# Patient Record
Sex: Female | Born: 1945 | Race: White | Hispanic: No | Marital: Married | Smoking: Never smoker
Health system: Southern US, Community
[De-identification: ages and names within clinical notes are randomized; demographics above are authoritative.]

## PROBLEM LIST (undated history)

## (undated) DIAGNOSIS — B029 Zoster without complications: Secondary | ICD-10-CM

## (undated) DIAGNOSIS — I10 Essential (primary) hypertension: Secondary | ICD-10-CM

## (undated) DIAGNOSIS — R0600 Dyspnea, unspecified: Secondary | ICD-10-CM

## (undated) DIAGNOSIS — R7301 Impaired fasting glucose: Secondary | ICD-10-CM

## (undated) DIAGNOSIS — R079 Chest pain, unspecified: Secondary | ICD-10-CM

## (undated) DIAGNOSIS — E039 Hypothyroidism, unspecified: Secondary | ICD-10-CM

## (undated) DIAGNOSIS — E079 Disorder of thyroid, unspecified: Secondary | ICD-10-CM

## (undated) DIAGNOSIS — E038 Other specified hypothyroidism: Secondary | ICD-10-CM

## (undated) HISTORY — DX: Chest pain, unspecified: R07.9

## (undated) HISTORY — DX: Disorder of thyroid, unspecified: E07.9

## (undated) HISTORY — PX: WRIST SURGERY: SHX841

## (undated) HISTORY — DX: Zoster without complications: B02.9

## (undated) HISTORY — DX: Dyspnea, unspecified: R06.00

## (undated) HISTORY — PX: KNEE ARTHROSCOPY: SUR90

## (undated) HISTORY — DX: Impaired fasting glucose: R73.01

## (undated) HISTORY — DX: Essential (primary) hypertension: I10

## (undated) HISTORY — PX: VEIN LIGATION AND STRIPPING: SHX2653

## (undated) HISTORY — DX: Hypothyroidism, unspecified: E03.9

## (undated) HISTORY — PX: LUNG REMOVAL, PARTIAL: SHX233

## (undated) HISTORY — DX: Other specified hypothyroidism: E03.8

---

## 1998-01-27 ENCOUNTER — Ambulatory Visit (HOSPITAL_COMMUNITY): Admission: RE | Admit: 1998-01-27 | Discharge: 1998-01-27 | Payer: Self-pay | Admitting: Obstetrics and Gynecology

## 1998-04-07 ENCOUNTER — Ambulatory Visit: Admission: RE | Admit: 1998-04-07 | Discharge: 1998-04-07 | Payer: Self-pay | Admitting: Obstetrics and Gynecology

## 1998-09-01 ENCOUNTER — Ambulatory Visit (HOSPITAL_COMMUNITY): Admission: RE | Admit: 1998-09-01 | Discharge: 1998-09-01 | Payer: Self-pay | Admitting: *Deleted

## 1998-09-01 ENCOUNTER — Encounter: Payer: Self-pay | Admitting: *Deleted

## 1999-04-06 ENCOUNTER — Ambulatory Visit (HOSPITAL_COMMUNITY): Admission: RE | Admit: 1999-04-06 | Discharge: 1999-04-06 | Payer: Self-pay | Admitting: Obstetrics and Gynecology

## 1999-04-06 ENCOUNTER — Encounter: Payer: Self-pay | Admitting: Obstetrics and Gynecology

## 1999-11-22 ENCOUNTER — Encounter: Admission: RE | Admit: 1999-11-22 | Discharge: 1999-11-22 | Payer: Self-pay | Admitting: Internal Medicine

## 1999-11-22 ENCOUNTER — Encounter: Payer: Self-pay | Admitting: Internal Medicine

## 1999-12-31 ENCOUNTER — Ambulatory Visit (HOSPITAL_COMMUNITY): Admission: RE | Admit: 1999-12-31 | Discharge: 1999-12-31 | Payer: Self-pay | Admitting: Gastroenterology

## 2000-06-09 ENCOUNTER — Other Ambulatory Visit: Admission: RE | Admit: 2000-06-09 | Discharge: 2000-06-09 | Payer: Self-pay | Admitting: Internal Medicine

## 2000-11-24 ENCOUNTER — Encounter: Admission: RE | Admit: 2000-11-24 | Discharge: 2000-11-24 | Payer: Self-pay | Admitting: Internal Medicine

## 2000-11-24 ENCOUNTER — Encounter: Payer: Self-pay | Admitting: Internal Medicine

## 2001-07-13 ENCOUNTER — Other Ambulatory Visit: Admission: RE | Admit: 2001-07-13 | Discharge: 2001-07-13 | Payer: Self-pay | Admitting: Internal Medicine

## 2001-08-24 ENCOUNTER — Encounter: Payer: Self-pay | Admitting: Internal Medicine

## 2001-08-24 ENCOUNTER — Ambulatory Visit (HOSPITAL_COMMUNITY): Admission: RE | Admit: 2001-08-24 | Discharge: 2001-08-24 | Payer: Self-pay | Admitting: Internal Medicine

## 2001-11-27 ENCOUNTER — Encounter: Admission: RE | Admit: 2001-11-27 | Discharge: 2001-11-27 | Payer: Self-pay | Admitting: Internal Medicine

## 2001-11-27 ENCOUNTER — Encounter: Payer: Self-pay | Admitting: Internal Medicine

## 2002-10-11 ENCOUNTER — Encounter: Payer: Self-pay | Admitting: Internal Medicine

## 2002-10-11 ENCOUNTER — Ambulatory Visit (HOSPITAL_COMMUNITY): Admission: RE | Admit: 2002-10-11 | Discharge: 2002-10-11 | Payer: Self-pay | Admitting: Internal Medicine

## 2002-10-11 ENCOUNTER — Other Ambulatory Visit: Admission: RE | Admit: 2002-10-11 | Discharge: 2002-10-11 | Payer: Self-pay | Admitting: Internal Medicine

## 2003-11-13 ENCOUNTER — Ambulatory Visit (HOSPITAL_COMMUNITY): Admission: RE | Admit: 2003-11-13 | Discharge: 2003-11-13 | Payer: Self-pay | Admitting: Internal Medicine

## 2003-12-23 ENCOUNTER — Other Ambulatory Visit: Admission: RE | Admit: 2003-12-23 | Discharge: 2003-12-23 | Payer: Self-pay | Admitting: Internal Medicine

## 2004-01-01 ENCOUNTER — Ambulatory Visit (HOSPITAL_COMMUNITY): Admission: RE | Admit: 2004-01-01 | Discharge: 2004-01-01 | Payer: Self-pay | Admitting: Orthopedic Surgery

## 2004-04-21 ENCOUNTER — Encounter: Admission: RE | Admit: 2004-04-21 | Discharge: 2004-04-21 | Payer: Self-pay | Admitting: Orthopedic Surgery

## 2005-01-14 ENCOUNTER — Encounter: Admission: RE | Admit: 2005-01-14 | Discharge: 2005-01-14 | Payer: Self-pay | Admitting: Internal Medicine

## 2005-03-25 ENCOUNTER — Other Ambulatory Visit: Admission: RE | Admit: 2005-03-25 | Discharge: 2005-03-25 | Payer: Self-pay | Admitting: Internal Medicine

## 2005-03-31 ENCOUNTER — Encounter: Admission: RE | Admit: 2005-03-31 | Discharge: 2005-03-31 | Payer: Self-pay | Admitting: Internal Medicine

## 2005-05-02 ENCOUNTER — Ambulatory Visit: Payer: Self-pay | Admitting: Cardiovascular Disease

## 2005-05-26 ENCOUNTER — Ambulatory Visit (HOSPITAL_COMMUNITY): Admission: RE | Admit: 2005-05-26 | Discharge: 2005-05-26 | Payer: Self-pay | Admitting: Cardiovascular Disease

## 2005-05-26 ENCOUNTER — Ambulatory Visit: Payer: Self-pay | Admitting: Cardiovascular Disease

## 2005-06-21 ENCOUNTER — Encounter: Admission: RE | Admit: 2005-06-21 | Discharge: 2005-06-21 | Payer: Self-pay | Admitting: Internal Medicine

## 2005-06-23 ENCOUNTER — Ambulatory Visit: Payer: Self-pay | Admitting: Cardiovascular Disease

## 2006-01-27 ENCOUNTER — Encounter: Admission: RE | Admit: 2006-01-27 | Discharge: 2006-01-27 | Payer: Self-pay | Admitting: Internal Medicine

## 2006-01-31 ENCOUNTER — Ambulatory Visit: Payer: Self-pay | Admitting: Oncology

## 2006-04-12 ENCOUNTER — Ambulatory Visit: Payer: Self-pay | Admitting: Oncology

## 2006-08-03 ENCOUNTER — Ambulatory Visit: Payer: Self-pay | Admitting: Cardiovascular Disease

## 2007-03-01 ENCOUNTER — Encounter: Admission: RE | Admit: 2007-03-01 | Discharge: 2007-03-01 | Payer: Self-pay | Admitting: Internal Medicine

## 2007-03-06 ENCOUNTER — Encounter: Admission: RE | Admit: 2007-03-06 | Discharge: 2007-03-06 | Payer: Self-pay | Admitting: Internal Medicine

## 2007-09-13 ENCOUNTER — Ambulatory Visit: Payer: Self-pay | Admitting: Cardiovascular Disease

## 2008-03-06 ENCOUNTER — Encounter: Admission: RE | Admit: 2008-03-06 | Discharge: 2008-03-06 | Payer: Self-pay | Admitting: Internal Medicine

## 2008-11-25 DIAGNOSIS — R079 Chest pain, unspecified: Secondary | ICD-10-CM

## 2008-11-25 DIAGNOSIS — R9431 Abnormal electrocardiogram [ECG] [EKG]: Secondary | ICD-10-CM

## 2008-11-25 DIAGNOSIS — E785 Hyperlipidemia, unspecified: Secondary | ICD-10-CM

## 2008-11-25 DIAGNOSIS — R0602 Shortness of breath: Secondary | ICD-10-CM

## 2008-11-27 ENCOUNTER — Ambulatory Visit: Payer: Self-pay | Admitting: Cardiovascular Disease

## 2009-03-13 ENCOUNTER — Encounter: Admission: RE | Admit: 2009-03-13 | Discharge: 2009-03-13 | Payer: Self-pay | Admitting: Internal Medicine

## 2009-10-08 ENCOUNTER — Encounter (INDEPENDENT_AMBULATORY_CARE_PROVIDER_SITE_OTHER): Payer: Self-pay | Admitting: *Deleted

## 2010-02-24 ENCOUNTER — Encounter: Admission: RE | Admit: 2010-02-24 | Discharge: 2010-02-24 | Payer: Self-pay | Admitting: Internal Medicine

## 2010-06-13 ENCOUNTER — Encounter: Payer: Self-pay | Admitting: Cardiovascular Disease

## 2010-06-22 NOTE — Letter (Signed)
Summary: Appointment - Reminder 2  [employer], Main Office  [address]   [place], [place] [address]   Phone: [phone]  Fax: [phone]     Oct 08, 2009 MRN: [id number]   [facility] [address] [place], [place]  [address]   Dear Ms. Garris,  Our records indicate that it is time to schedule a follow-up appointment with Dr. [name]. It is very important that we reach you to schedule this appointment. We look forward to participating in your health care needs. Please contact us at the number listed above at your earliest convenience to schedule your appointment.  If you are unable to make an appointment at this time, give [place] a call so we can update our records.     Sincerely,  [facility] Scheduling Team

## 2010-07-19 ENCOUNTER — Encounter: Payer: Self-pay | Admitting: Cardiovascular Disease

## 2010-07-19 ENCOUNTER — Ambulatory Visit (INDEPENDENT_AMBULATORY_CARE_PROVIDER_SITE_OTHER): Payer: Medicare Other | Admitting: Cardiovascular Disease

## 2010-07-19 DIAGNOSIS — E785 Hyperlipidemia, unspecified: Secondary | ICD-10-CM

## 2010-07-19 DIAGNOSIS — I4510 Unspecified right bundle-branch block: Secondary | ICD-10-CM

## 2010-07-29 NOTE — Assessment & Plan Note (Signed)
Summary: ROV GD PER PT CALL. GD/CT appt confirm=mj   History of Present Illness: HISTORY:  Bridget Hartman returns today for followup.  She has had atypical chest pain in the past.  Normal myovue in 2004  She has had a cardiac CT done in January 2007.She had a calcium score of zero.  No significant coronary artery disease.  She has hypercholesterolemia.  She has recently had this checked by Dr. Clelia Croft.  Her LFTs were normal.  She recounts that her total cholesterol was in the 200 range.  She is on 10 of Lipitor.  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  Recommended weight loss low sodium diet and lifestyle adjustments   Current Problems (verified): 1)  Electrocardiogram, Abnormal  (ICD-794.31) 2)  Shortness of Breath  (ICD-786.05) 3)  Hyperlipidemia  (ICD-272.4) 4)  Chest Pain  (ICD-786.50)  Current Medications (verified): 1)  Lipitor 10 Mg Tabs (Atorvastatin Calcium) .... Take One Tablet By Mouth Daily. 2)  Multivitamins   Tabs (Multiple Vitamin) .Marland Kitchen.. 1 Tab By Mouth Once Daily 3)  Flax   Oil (Flaxseed (Linseed)) .Marland Kitchen.. 1 Tab By Mouth Once Daily 4)  Calcium 1200 1200-1000 Mg-Unit Chew (Calcium Carbonate-Vit D-Min) .Marland Kitchen.. 1 By Mouth Daily 5)  Glucosamine 1500 Complex  Caps (Glucosamine-Chondroit-Vit C-Mn) .Marland Kitchen.. 1 By Mouth Daily 6)  B Complex-Vitamin C  Caps (B Complex-C) .Marland Kitchen.. 1 By Mouth Daily  Allergies (verified): No Known Drug Allergies  Past History:  Past Medical History: Last updated: 11/25/2008 Current Problems:  SHORTNESS OF BREATH (ICD-786.05) HYPERLIPIDEMIA (ICD-272.4) CHEST PAIN (ICD-786.50) calcium score 0 normal cardiac CT 2007 Bifasicular Block (RBBB LAD)  Past Surgical History: Last updated: 11/25/2008  Colonoscopy Cysts wrist and shoulder Lower lobe of lung removed Varicose veins of leg  Family History: Last updated: 11/25/2008 noncontributory  Social History: Last updated: 11/27/2008 Retired    non-smoker non-drinker Swims 26mile/day in pool in back yard  Review of Systems       Denies fever, malais, weight loss, blurry vision, decreased visual acuity, cough, sputum, SOB, hemoptysis, pleuritic pain, palpitaitons, heartburn, abdominal pain, melena, lower extremity edema, claudication, or rash.   Vital Signs:  Patient profile:   65 year old female Height:      66 inches Weight:      187 pounds BMI:     30.29 Pulse rate:   92 / minute Resp:     16 per minute BP sitting:   135 / 88  (right arm)  Vitals Entered By: Marrion Coy, CNA (July 19, 2010 10:27 AM)  Physical Exam  General:  Affect appropriate Healthy:  appears stated age HEENT: normal Neck supple with no adenopathy JVP normal no bruits no thyromegaly Lungs clear with no wheezing and good diaphragmatic motion Heart:  S1/S2 no murmur,rub, gallop or click PMI normal Abdomen: benighn, BS positve, no tenderness, no AAA no bruit.  No HSM or HJR Distal pulses intact with no bruits No edema Neuro non-focal Skin warm and dry    Impression & Recommendations:  Problem # 1:  ELECTROCARDIOGRAM, ABNORMAL (ICD-794.31) No change ICRBBB yearly ECG advisable  Problem # 2:  HYPERLIPIDEMIA (ICD-272.4) At goal labs with Dr Clelia Croft Her updated medication list for this problem includes:    Lipitor 10 Mg Tabs (Atorvastatin calcium) .Marland Kitchen... Take one tablet by mouth daily.  Problem # 3:  CHEST PAIN (ICD-786.50) Non recurrent.  Normal CARADIAC CT 2007   EKG Report  Procedure date:  07/19/2010  Findings:      NSR 92 ICRBBB LAF B

## 2010-10-05 NOTE — Assessment & Plan Note (Signed)
[facility]                            CARDIOLOGY OFFICE NOTE   Bridget Hartman, Bridget Hartman                       MRN:          [id number]  DATE:09/13/2007                            DOB:          Oct 27, 1945    HISTORY:  Bridget Hartman returns today for followup.  She has had atypical  chest pain in the past.  She has had a cardiac CT done in January 2007.  She had a calcium score of zero.  No significant coronary artery  disease.  She has hypercholesterolemia.  She has recently had this  checked by Bridget Hartman.  Her LFTs were normal.  She recounts that her total  cholesterol was in the 200 range.  She is on 10 of Lipitor.   REVIEW OF SYSTEMS:  Remarkable for occasional muscle aches and pains.  It does not sound like significant myalgias.   The patient is also status post previous left lower lobectomy for a  cyst.  She has not had any recurrences, but has mild chronic exertional  dyspnea.   MEDICATIONS:  1. Lipitor 10 a day.  2. Multivitamins.  3. Flaxseed oil.   PRIMARY CARE PHYSICIAN:  Now is Bridget Hartman.   PHYSICAL EXAMINATION:  GENERAL:  Remarkable for a healthy-appearing  elderly white female in no distress.  VITAL SIGNS:  Weight is 189, blood  pressure is 150/80, pulse 80 and regular, respiratory rate 14, afebrile.  HEENT:  Unremarkable.  NECK:  Carotids are normal without bruit, no lymphadenopathy,  thyromegaly or JVP elevation.  LUNGS:  Clear with good diaphragmatic motion.  There is no wheezing.  There is slight volume loss in the left lower lobe.  HEART:  There is an S1-S2 with normal heart sounds.  PMI normal.  ABDOMEN:  Benign.  Bowel sounds are positive.  No AAA, no tenderness, no  hepatosplenomegaly or hepatojugular reflux.  EXTREMITIES:  Distal pulses are intact, no edema.  No muscular weakness.  NEURO:  Nonfocal.  SKIN:  Warm and dry.   DIAGNOSTICS:  EKG shows sinus rhythm with an incomplete right bundle  branch block, left  anterior fascicular block.   IMPRESSION:  1. Previous atypical chest pain, calcium score is equal to zero,      possible follow up stress test in 2010 which will be 3 years.  2. Hyperlipidemia.  Continue Lipitor or follow up with Bridget Hartman.  3. Abnormal electrocardiogram with right bundle branch block and left      anterior fascicular block.  No evidence of high-grade heart block      or syncope.  Continue to monitor ECG yearly.   FOLLOW UP:  I will see her back in 12 months.     [name]. [name], MD, [facility]  Electronically Signed    PCN/MedQ  DD: 09/13/2007  DT: 09/13/2007  Job #: [id number]

## 2010-10-08 NOTE — Procedures (Signed)
[facility]  Patient:    Bridget Hartman, Bridget Hartman                       MRN: [id number] Proc. Date: 12/31/99 Adm. Date:  [id number] Attending:  Louie Bun CC:         [name]. Lenord Fellers, M.D.   Procedure Report  PROCEDURE:  Colonoscopy.  INDICATION FOR PROCEDURE:  Family history of colon cancer in a first degree relative with the last colonoscopy 5 years ago.  DESCRIPTION OF PROCEDURE:  The patient was placed in the left lateral decubitus position and placed on the pulse monitor with continuous low flow oxygen delivered by nasal cannula. She was sedated with 90 mg IV Demerol and 10 mg IV Versed. The Olympus video colonoscope was inserted into the rectum and advanced to the cecum, confirmed by transillumination at McBurneys point and visualization of the ileocecal valve and appendiceal orifice. The prep was excellent. The cecum, ascending, transverse, descending and sigmoid colon all appeared normal with no masses, polyps, diverticula or other mucosal abnormalities. The rectum likewise appeared normal and retroflexed view of the anus did reveal some small internal hemorrhoids. The colonoscope was then withdrawn and the patient returned to the recovery room in stable condition. The patient tolerated the procedure well and there were no immediate complications.  IMPRESSION:  Internal hemorrhoids otherwise normal colonoscopy.  PLAN:  Repeat colonoscopy in 5 years. DD:  12/31/99 TD:  01/01/00 Job: [id number]

## 2010-10-08 NOTE — Assessment & Plan Note (Signed)
[facility]                            CARDIOLOGY OFFICE NOTE   KARLIE, AUNG                         MRN:          [id number]  DATE:08/03/2006                            DOB:          Jun 29, 1945    SUBJECTIVE:  [name] returns today for followup.  She had her  coronary CTA.  Her calcium score was 0.  There was no evidence of any  significant plaque.  She had a normal right dominant circulation.  Her  ejection fraction was 65%.  She was noted to have a previous left lower  lobe lobectomy.   The patient had some food poisoning at [employer] last night.  She is  feeling a little bit ill today.  Otherwise she has no complaints.  She  continues to debate whether or not to take her Lipitor.  Unfortunately I  do not really have her most recent lipid panel.  Her weight seems to be  down according to our scales.  We had a long discussion regarding the  relative value of a 0 calcium score and the treatment of her  hyperlipidemia.  She will make up her own mind in regards to this.  Her  LDL in the past has been above 125, but she has an HDL of 69.  Her  shortness of breath is improved and most likely previously related to  her lobectomy.   PHYSICAL EXAMINATION:  GENERAL:  She looks scared.  VITAL SIGNS:  Blood pressure 130/88, pulse 80 and regular.  HEENT:  Normal.  LUNGS:  Clear.  She is status post left lobectomy.  HEART:  S1 and S2.  Normal heart tones.  ABDOMEN:  Benign.  EXTREMITIES:  Intact.  No edema.   IMPRESSION:  1. Shortness of breath, resolved, probably related to previous      lobectomy.  2. Currently fighting food poisoning.  3. Hyperlipidemia.   PLAN:  To continue diet therapy and Lipitor 10 mg q.d.  Possible follow-  up calcium score in three years.  Follow up the stress test in five  years.  I will see her on a yearly basis if she wants.     [name]. [name], MD, [facility]  Electronically Signed    PCN/MedQ  DD: 08/03/2006   DT: 08/04/2006  Job #: [id number]   cc:   [name]. Lenord Fellers, M.D.

## 2011-01-17 ENCOUNTER — Other Ambulatory Visit: Payer: Self-pay | Admitting: Obstetrics & Gynecology

## 2011-01-17 DIAGNOSIS — Z1231 Encounter for screening mammogram for malignant neoplasm of breast: Secondary | ICD-10-CM

## 2011-02-28 ENCOUNTER — Ambulatory Visit
Admission: RE | Admit: 2011-02-28 | Discharge: 2011-02-28 | Disposition: A | Discharge status: Home / Self Care | Payer: Medicare Other | Source: Ambulatory Visit | Attending: Obstetrics & Gynecology | Admitting: Obstetrics & Gynecology

## 2011-02-28 DIAGNOSIS — Z1231 Encounter for screening mammogram for malignant neoplasm of breast: Secondary | ICD-10-CM

## 2011-09-12 ENCOUNTER — Telehealth: Payer: Self-pay | Admitting: Cardiovascular Disease

## 2011-09-12 NOTE — Telephone Encounter (Signed)
New Problem:     I called the patient and was unable to reach them. I was unable to leave a message, the phone rang and lead to several tones before ending the call.

## 2012-01-03 ENCOUNTER — Other Ambulatory Visit: Payer: Self-pay | Admitting: Obstetrics & Gynecology

## 2012-01-03 DIAGNOSIS — Z1231 Encounter for screening mammogram for malignant neoplasm of breast: Secondary | ICD-10-CM

## 2012-01-12 ENCOUNTER — Other Ambulatory Visit: Payer: Self-pay | Admitting: Obstetrics & Gynecology

## 2012-01-12 DIAGNOSIS — M8580 Other specified disorders of bone density and structure, unspecified site: Secondary | ICD-10-CM

## 2012-03-05 ENCOUNTER — Ambulatory Visit
Admission: RE | Admit: 2012-03-05 | Discharge: 2012-03-05 | Disposition: A | Discharge status: Home / Self Care | Payer: Medicare Other | Source: Ambulatory Visit | Attending: Obstetrics & Gynecology | Admitting: Obstetrics & Gynecology

## 2012-03-05 DIAGNOSIS — M8580 Other specified disorders of bone density and structure, unspecified site: Secondary | ICD-10-CM

## 2012-03-05 DIAGNOSIS — Z1231 Encounter for screening mammogram for malignant neoplasm of breast: Secondary | ICD-10-CM

## 2013-01-29 ENCOUNTER — Other Ambulatory Visit: Payer: Self-pay

## 2013-01-29 DIAGNOSIS — Z1231 Encounter for screening mammogram for malignant neoplasm of breast: Secondary | ICD-10-CM

## 2013-03-06 ENCOUNTER — Ambulatory Visit
Admission: RE | Admit: 2013-03-06 | Discharge: 2013-03-06 | Disposition: A | Discharge status: Home / Self Care | Payer: Medicare PPO | Source: Ambulatory Visit

## 2013-03-06 DIAGNOSIS — Z1231 Encounter for screening mammogram for malignant neoplasm of breast: Secondary | ICD-10-CM

## 2014-01-28 ENCOUNTER — Other Ambulatory Visit: Payer: Self-pay

## 2014-01-28 DIAGNOSIS — Z1231 Encounter for screening mammogram for malignant neoplasm of breast: Secondary | ICD-10-CM

## 2014-02-25 ENCOUNTER — Ambulatory Visit: Payer: Medicare PPO

## 2014-02-26 ENCOUNTER — Ambulatory Visit
Admission: RE | Admit: 2014-02-26 | Discharge: 2014-02-26 | Disposition: A | Discharge status: Home / Self Care | Payer: Medicare Other | Source: Ambulatory Visit

## 2014-02-26 DIAGNOSIS — Z1231 Encounter for screening mammogram for malignant neoplasm of breast: Secondary | ICD-10-CM

## 2014-03-14 ENCOUNTER — Other Ambulatory Visit: Payer: Self-pay | Admitting: Orthopedic Surgery

## 2014-03-14 DIAGNOSIS — M25562 Pain in left knee: Secondary | ICD-10-CM

## 2014-03-25 ENCOUNTER — Ambulatory Visit
Admission: RE | Admit: 2014-03-25 | Discharge: 2014-03-25 | Disposition: A | Discharge status: Home / Self Care | Payer: Medicare Other | Source: Ambulatory Visit | Attending: Orthopedic Surgery | Admitting: Orthopedic Surgery

## 2014-03-25 DIAGNOSIS — M25562 Pain in left knee: Secondary | ICD-10-CM

## 2015-01-27 ENCOUNTER — Other Ambulatory Visit: Payer: Self-pay

## 2015-01-27 DIAGNOSIS — Z1231 Encounter for screening mammogram for malignant neoplasm of breast: Secondary | ICD-10-CM

## 2015-02-27 ENCOUNTER — Ambulatory Visit
Admission: RE | Admit: 2015-02-27 | Discharge: 2015-02-27 | Disposition: A | Discharge status: Home / Self Care | Payer: Medicare Other | Source: Ambulatory Visit

## 2015-02-27 DIAGNOSIS — Z1231 Encounter for screening mammogram for malignant neoplasm of breast: Secondary | ICD-10-CM

## 2015-04-06 ENCOUNTER — Encounter: Payer: Self-pay | Admitting: Cardiovascular Disease

## 2015-04-06 ENCOUNTER — Ambulatory Visit (INDEPENDENT_AMBULATORY_CARE_PROVIDER_SITE_OTHER): Payer: Medicare Other | Admitting: Cardiovascular Disease

## 2015-04-06 VITALS — BP 140/80 | HR 95 | Ht 66.0 in | Wt 187.4 lb

## 2015-04-06 DIAGNOSIS — R079 Chest pain, unspecified: Secondary | ICD-10-CM

## 2015-04-06 DIAGNOSIS — R0600 Dyspnea, unspecified: Secondary | ICD-10-CM

## 2015-04-06 NOTE — Progress Notes (Signed)
Patient ID: Bridget Hartman, female   DOB: 1946-05-11, 69 y.o.   MRN: [id number]     Cardiology Office Note   Date:  04/06/2015   ID:  Bridget Hartman, DOB 11/08/1945, MRN [id number]  PCP:  [facility] Marchelle Gearing NP   Cardiologist:   Charlton Haws, MD   Chief Complaint  Patient presents with  . New Evaluation    chest discomfort      History of Present Illness: Bridget Hartman is a 69 y.o. female who presents for evaluation of chest pain and dyspnea . Symptoms chronic over several years.  Intermittent.  Can be exertional with square dancing Swimming and going up stairs.  Pain only 1/0 scale.  Has a cough but no sputum, fever.  Non smoker.  She had a cardiac calcium score and CTA back in 2007 that I performed with score Of 0 and normal right dominant arteries.   Has not had testing since then Activity more limited by bilateral knee pain Previous arthroscopic surgery on left. She indicates history of murmur Pain is sharp fleeting not related to food.  Been on statin for years     Past Medical History  Diagnosis Date  . Dyspnea   . Chest pain   . Hypertension   . Impaired fasting glucose   . Shingles   . Thyroid disease   . Subclinical hypothyroidism     Past Surgical History  Procedure Laterality Date  . Vein ligation and stripping    . Wrist surgery Left   . Knee arthroscopy Left   . Lung removal, partial       Current Outpatient Prescriptions  Medication Sig Dispense Refill  . atorvastatin (LIPITOR) 10 MG tablet Take 10 mg by mouth daily.    . Calcium Carb-Cholecalciferol (CALCIUM 600 + D PO) Take 1 tablet by mouth daily.    . Multiple Vitamins-Minerals (MULTIVITAMIN WITH MINERALS) tablet Take 1 tablet by mouth daily.    . Omega-3 Fatty Acids (FISH OIL TRIPLE STRENGTH) 1400 MG CAPS Take 1 capsule by mouth daily.     No current facility-administered medications for this visit.    Allergies:   Review of patient's allergies indicates no known  allergies.    Social History:  The patient  reports that she has never smoked. She does not have any smokeless tobacco history on file.   Family History:  The patient's family history includes Atrial fibrillation in her father; Breast cancer in her mother; Cervical cancer in her sister; Colon cancer in her mother; Diabetes in her paternal grandfather; Diabetes type II in her sister; Gallbladder disease in her paternal grandfather; Heart attack in her paternal grandfather; Heart disease in her paternal grandmother; Hypertension in her mother; Thyroid disease in her mother.    ROS:  Please see the history of present illness.   Otherwise, review of systems are positive for none.   All other systems are reviewed and negative.    PHYSICAL EXAM: VS:  BP 160/80 mmHg  Pulse 100  Ht  (1.676 m)  Wt 85.004 kg (187 lb 6.4 oz)  BMI 30.26 kg/m2  LMP  (LMP Unknown) , BMI Body mass index is 30.26 kg/(m^2). Affect appropriate Healthy:  appears stated age HEENT: normal Neck supple with no adenopathy JVP normal no bruits no thyromegaly Lungs clear with no wheezing and good diaphragmatic motion Heart:  S1/S2 no murmur, no rub, gallop or click PMI normal Abdomen: benighn, BS positve, no tenderness, no AAA no  bruit.  No HSM or HJR Distal pulses intact with no bruits No edema Neuro non-focal Skin warm and dry No muscular weakness    EKG:   03/24/15  SR rate 84  ICRBBB LAD poor R wave progression    Recent Labs: No results found for requested labs within last 365 days.    Lipid Panel No results found for: CHOL, TRIG, HDL, CHOLHDL, VLDL, LDLCALC, LDLDIRECT    Wt Readings from Last 3 Encounters:  04/06/15 85.004 kg (187 lb 6.4 oz)  03/25/14 81.647 kg (180 lb)  07/19/10 84.823 kg (187 lb)      Other studies Reviewed: Additional studies/ records that were reviewed today include: Epic notes Primary records .    ASSESSMENT AND PLAN:  1.  Chest Pain:  Atypical , abnormal ECG   History of normal cors CTA 2007 Unable to walk on treadmill due to knee pain  F/U lexiscan myovue 2. Dyspnea:  Seems functional  F/u echo for RV/LV function 3. Murmur :  By history 1/6 SEM benign see above echo 4. HTN:  Needs home BP monitoring and consideration for Rx lifestyle changes discussed 5. Lung Cacner:  Distant LLL resection contributes to dyspnea lungs clear on exam She indicates normal CXR 2 weeks ago at Dr Alver FisherShaw's office    Current medicines are reviewed at length with the patient today.  The patient does not have concerns regarding medicines.  The following changes have been made:  no change  Labs/ tests ordered today include: Echo and Lexiscan myovue   No orders of the defined types were placed in this encounter.     Disposition:   FU with me in a year      Signed, Charlton HawsPeter Laruen Risser, MD  04/06/2015 3:47 PM    [facility] [address], [place], [place]  [address] Phone: [phone]) [phone]; Fax: [phone]) [phone]

## 2015-04-06 NOTE — Patient Instructions (Signed)
Medication Instructions:  Your physician recommends that you continue on your current medications as directed. Please refer to the Current Medication list given to you today.   Labwork: NONE  Testing/Procedures: Your physician has requested that you have an echocardiogram. Echocardiography is a painless test that uses sound waves to create images of your heart. It provides your doctor with information about the size and shape of your heart and how well your heart's chambers and valves are working. This procedure takes approximately one hour. There are no restrictions for this procedure. Your physician has requested that you have a lexiscan myoview. For further information please visit [email or web address]. Please follow instruction sheet, as given.   Follow-Up: Your physician wants you to follow-up in:  YEAR WITH   DR Haywood FillerNISHAN  You will receive a reminder letter in the mail two months in advance. If you don't receive a letter, please call our office to schedule the follow-up appointment.  Any Other Special Instructions Will Be Listed Below (If Applicable).     If you need a refill on your cardiac medications before your next appointment, please call your pharmacy.

## 2015-04-20 ENCOUNTER — Ambulatory Visit (HOSPITAL_COMMUNITY): Payer: Medicare Other | Attending: Internal Medicine

## 2015-04-20 ENCOUNTER — Telehealth (HOSPITAL_COMMUNITY): Payer: Self-pay | Admitting: *Deleted

## 2015-04-20 ENCOUNTER — Other Ambulatory Visit: Payer: Self-pay

## 2015-04-20 DIAGNOSIS — I517 Cardiomegaly: Secondary | ICD-10-CM | POA: Diagnosis not present

## 2015-04-20 DIAGNOSIS — I5189 Other ill-defined heart diseases: Secondary | ICD-10-CM | POA: Insufficient documentation

## 2015-04-20 DIAGNOSIS — R079 Chest pain, unspecified: Secondary | ICD-10-CM | POA: Diagnosis not present

## 2015-04-20 DIAGNOSIS — R0600 Dyspnea, unspecified: Secondary | ICD-10-CM

## 2015-04-20 DIAGNOSIS — I071 Rheumatic tricuspid insufficiency: Secondary | ICD-10-CM | POA: Insufficient documentation

## 2015-04-20 NOTE — Telephone Encounter (Signed)
Patient given detailed instructions per Myocardial Perfusion Study Information Sheet for the test on 04/22/15 at 07:45. Patient notified to arrive 15 minutes early and that it is imperative to arrive on time for appointment to keep from having the test rescheduled.  If you need to cancel or reschedule your appointment, please call the office within 24 hours of your appointment. Failure to do so may result in a cancellation of your appointment, and a $50 no show fee. Patient verbalized understanding.Bridget Hartman [name]

## 2015-04-22 ENCOUNTER — Ambulatory Visit (HOSPITAL_COMMUNITY): Payer: Medicare Other | Attending: Cardiovascular Disease

## 2015-04-22 DIAGNOSIS — R079 Chest pain, unspecified: Secondary | ICD-10-CM

## 2015-04-22 DIAGNOSIS — I4510 Unspecified right bundle-branch block: Secondary | ICD-10-CM | POA: Diagnosis not present

## 2015-04-22 DIAGNOSIS — R0609 Other forms of dyspnea: Secondary | ICD-10-CM | POA: Insufficient documentation

## 2015-04-22 DIAGNOSIS — I10 Essential (primary) hypertension: Secondary | ICD-10-CM | POA: Insufficient documentation

## 2015-04-22 LAB — MYOCARDIAL PERFUSION IMAGING
CHL CUP NUCLEAR SDS: 6
CHL CUP NUCLEAR SRS: 1
CHL CUP RESTING HR STRESS: 77 {beats}/min
CSEPPHR: 104 {beats}/min
LV sys vol: 23 mL
LVDIAVOL: 71 mL
RATE: 0.27
SSS: 7
TID: 0.86

## 2015-04-22 MED ORDER — TECHNETIUM TC 99M SESTAMIBI GENERIC - CARDIOLITE
31.4000 | Freq: Once | INTRAVENOUS | Status: AC | PRN
  Administered 2015-04-22: 31 via INTRAVENOUS

## 2015-04-22 MED ORDER — REGADENOSON 0.4 MG/5ML IV SOLN
0.4000 mg | Freq: Once | INTRAVENOUS | Status: AC
  Administered 2015-04-22: 0.4 mg via INTRAVENOUS

## 2015-04-22 MED ORDER — TECHNETIUM TC 99M SESTAMIBI GENERIC - CARDIOLITE
10.8000 | Freq: Once | INTRAVENOUS | Status: AC | PRN
  Administered 2015-04-22: 11 via INTRAVENOUS

## 2016-01-29 ENCOUNTER — Other Ambulatory Visit: Payer: Self-pay | Admitting: Internal Medicine

## 2016-01-29 DIAGNOSIS — Z1231 Encounter for screening mammogram for malignant neoplasm of breast: Secondary | ICD-10-CM

## 2016-02-29 ENCOUNTER — Ambulatory Visit
Admission: RE | Admit: 2016-02-29 | Discharge: 2016-02-29 | Disposition: A | Discharge status: Home / Self Care | Payer: Medicare Other | Source: Ambulatory Visit | Attending: Internal Medicine | Admitting: Internal Medicine

## 2016-02-29 DIAGNOSIS — Z1231 Encounter for screening mammogram for malignant neoplasm of breast: Secondary | ICD-10-CM

## 2016-06-15 NOTE — Progress Notes (Signed)
Patient ID: Bridget Hartman, female   DOB: 1946/04/08, 71 y.o.   MRN: [id number]     Cardiology Office Note   Date:  06/23/2016   ID:  Bridget MccreedyLinda K Hartman, DOB 1946/04/08, MRN [id number]  [facility]:  [facility] Mar[facility]le Gearing[facility]ittany D [place] NP   Cardiologist:   Charlton HawsPeter Angelicia Lessner, MD   Chief Complaint  Patient presents with  . Shortness of Breath      History of Present Illness: Bridget MccreedyLinda K Hartman is a 71 y.o. female who presents for f/u  of chest pain and dyspnea .Initially evaluated in 2016   She is a Non smoker.  She had a cardiac calcium score and CTA back in 2007 that I performed with score Of 0 and normal right dominant arteries.  Activity more limited by bilateral knee pain Previous arthroscopic surgery on left.  She indicates history of murmur  Myovue 04/22/15 normal no ischemia EF 68%  04/21/15 Echo essentially normal   - Technically difficult study. LVEF 60-65%, mild LVH, normal wall   motion, diastolic dysfunction, indeterminate LV filling pressure,   normal LA size, trivial TR, normal RVSP.  Past Medical History:  Diagnosis Date  . Chest pain   . Dyspnea   . Hypertension   . Impaired fasting glucose   . Shingles   . Subclinical hypothyroidism   . Thyroid disease     Past Surgical History:  Procedure Laterality Date  . KNEE ARTHROSCOPY Left   . LUNG REMOVAL, PARTIAL    . VEIN LIGATION AND STRIPPING    . WRIST SURGERY Left      Current Outpatient Prescriptions  Medication Sig Dispense Refill  . atorvastatin (LIPITOR) 10 MG tablet Take 10 mg by mouth daily.    . Calcium Carb-Cholecalciferol (CALCIUM 600 + D PO) Take 1 tablet by mouth daily.    . hydrochlorothiazide (MICROZIDE) 12.5 MG capsule Take 12.5 mg by mouth daily.  6  . Multiple Vitamins-Minerals (MULTIVITAMIN WITH MINERALS) tablet Take 1 tablet by mouth daily.    . Omega-3 Fatty Acids (FISH OIL TRIPLE STRENGTH) 1400 MG CAPS Take 1 capsule by mouth daily.     No current facility-administered medications for this  visit.     Allergies:   Patient has no known allergies.    Social History:  The patient  reports that she has never smoked. She has never used smokeless tobacco.   Family History:  The patient's family history includes Atrial fibrillation in her father; Breast cancer in her mother; Cervical cancer in her sister; Colon cancer in her mother; Diabetes in her paternal grandfather; Diabetes type II in her sister; Gallbladder disease in her paternal grandfather; Heart attack in her paternal grandfather; Heart disease in her paternal grandmother; Hypertension in her mother; Thyroid disease in her mother.    ROS:  Please see the history of present illness.   Otherwise, review of systems are positive for none.   All other systems are reviewed and negative.    PHYSICAL EXAM: VS:  BP (!) 160/100   Pulse 89   Ht 5\' 6"  (1.676 m)   Wt 184 lb 3.2 oz (83.6 kg)   LMP  (LMP Unknown)   SpO2 98%   BMI 29.73 kg/m  , BMI Body mass index is 29.73 kg/m. Affect appropriate Healthy:  appears stated age HEENT: normal Neck supple with no adenopathy JVP normal no bruits no thyromegaly Lungs clear with no wheezing and good diaphragmatic motion Heart:  S1/S2  1/6 SEM  murmur, no rub,  gallop or click PMI normal Abdomen: benighn, BS positve, no tenderness, no AAA no bruit.  No HSM or HJR Distal pulses intact with no bruits No edema Neuro non-focal Skin warm and dry No muscular weakness    EKG:   03/24/15  SR rate 84  ICRBBB LAD poor R wave progression  06/23/16  SR rate 94 RBBB LAFB    Recent Labs: No results found for requested labs within last 8760 hours.    Lipid Panel No results found for: CHOL, TRIG, HDL, CHOLHDL, VLDL, LDLCALC, LDLDIRECT    Wt Readings from Last 3 Encounters:  06/23/16 184 lb 3.2 oz (83.6 kg)  04/22/15 187 lb (84.8 kg)  04/06/15 187 lb 6.4 oz (85 kg)      Other studies Reviewed: Additional studies/ records that were reviewed today include: Epic notes Primary records  . Echo and myovue from 2016     ASSESSMENT AND PLAN:  1.  Chest Pain:  Atypical resolved normal myovue 2016 2. Dyspnea:  Seems functional  Echo 04/20/15 normal EF normal PA pressure and no valve disease  3. Murmur : benign no valve disease on echo  4. HTN:  Needs home BP monitoring and consideration for Rx lifestyle changes discussed Followed By Dr Clelia Croft Diuretic new last week thought she was to be on quinipril as well  5. Lung Cacner:  Distant LLL resection contributes to dyspnea lungs clear on exam   CXR ordered She indicated resection age 43 for cyst  6. Bifasicular block ICRBBB/LAFB stable f/u ECG in a year    Current medicines are reviewed at length with the patient today.  The patient does not have concerns regarding medicines.  The following changes have been made:  no change  Labs/ tests ordered today include: Echo and Lexiscan myovue    Orders Placed This Encounter  Procedures  . EKG 12-Lead     Disposition:   FU with me in a year      Signed, Charlton Haws, MD  06/23/2016 11:25 AM    [facility] [address] [place], [place], [place]  [address] Phone: [phone]; Fax: [phone]

## 2016-06-23 ENCOUNTER — Ambulatory Visit (INDEPENDENT_AMBULATORY_CARE_PROVIDER_SITE_OTHER): Payer: Medicare Other | Admitting: Cardiovascular Disease

## 2016-06-23 ENCOUNTER — Encounter: Payer: Self-pay | Admitting: Cardiovascular Disease

## 2016-06-23 ENCOUNTER — Encounter (INDEPENDENT_AMBULATORY_CARE_PROVIDER_SITE_OTHER): Payer: Self-pay

## 2016-06-23 VITALS — BP 160/100 | HR 89 | Ht 66.0 in | Wt 184.2 lb

## 2016-06-23 DIAGNOSIS — R0600 Dyspnea, unspecified: Secondary | ICD-10-CM

## 2016-06-23 NOTE — Patient Instructions (Addendum)
Medication Instructions:  Your physician recommends that you continue on your current medications as directed. Please refer to the Current Medication list given to you today.  Labwork: NONE  Testing/Procedures: A chest x-ray takes a picture of the organs and structures inside the chest, including the heart, lungs, and blood vessels. This test can show several things, including, whether the heart is enlarges; whether fluid is building up in the lungs; and whether pacemaker / defibrillator leads are still in place. Where to go - [facility] [address] [place], [place] [address]  Follow-Up: Your physician wants you to follow-up in: 12 months with Dr. Eden EmmsNishan. You will receive a reminder letter in the mail two months in advance. If you don't receive a letter, please call our office to schedule the follow-up appointment.   If you need a refill on your cardiac medications before your next appointment, please call your pharmacy.

## 2016-06-28 ENCOUNTER — Ambulatory Visit (INDEPENDENT_AMBULATORY_CARE_PROVIDER_SITE_OTHER)
Admission: RE | Admit: 2016-06-28 | Discharge: 2016-06-28 | Disposition: A | Discharge status: Home / Self Care | Payer: Medicare Other | Source: Ambulatory Visit | Attending: Cardiovascular Disease | Admitting: Cardiovascular Disease

## 2016-06-28 DIAGNOSIS — R0600 Dyspnea, unspecified: Secondary | ICD-10-CM

## 2017-01-31 ENCOUNTER — Other Ambulatory Visit: Payer: Self-pay | Admitting: Internal Medicine

## 2017-01-31 DIAGNOSIS — Z1231 Encounter for screening mammogram for malignant neoplasm of breast: Secondary | ICD-10-CM

## 2017-03-13 ENCOUNTER — Ambulatory Visit
Admission: RE | Admit: 2017-03-13 | Discharge: 2017-03-13 | Disposition: A | Discharge status: Home / Self Care | Payer: Medicare Other | Source: Ambulatory Visit | Attending: Internal Medicine | Admitting: Internal Medicine

## 2017-03-13 DIAGNOSIS — Z1231 Encounter for screening mammogram for malignant neoplasm of breast: Secondary | ICD-10-CM

## 2017-08-15 ENCOUNTER — Encounter: Payer: Self-pay | Admitting: Obstetrics & Gynecology

## 2017-08-15 ENCOUNTER — Ambulatory Visit (INDEPENDENT_AMBULATORY_CARE_PROVIDER_SITE_OTHER): Payer: Medicare Other | Admitting: Obstetrics & Gynecology

## 2017-08-15 VITALS — BP 140/78 | Ht 65.5 in | Wt 194.0 lb

## 2017-08-15 DIAGNOSIS — Z01411 Encounter for gynecological examination (general) (routine) with abnormal findings: Secondary | ICD-10-CM | POA: Diagnosis not present

## 2017-08-15 DIAGNOSIS — Z13820 Encounter for screening for osteoporosis: Secondary | ICD-10-CM | POA: Diagnosis not present

## 2017-08-15 DIAGNOSIS — Z124 Encounter for screening for malignant neoplasm of cervix: Secondary | ICD-10-CM

## 2017-08-15 DIAGNOSIS — Z780 Asymptomatic menopausal state: Secondary | ICD-10-CM

## 2017-08-15 NOTE — Progress Notes (Signed)
Bridget MccreedyLinda K Hartman 1945/12/25 [id number]   History:    72 y.o. G1P1L1 Married.  Enjoying traveling and swimming in her pool.  RP:  Established patient presenting for annual gyn exam   HPI: Menopause, well on no hormone replacement therapy.  No postmenopausal bleeding.  No pelvic pain.  Normal vaginal secretions.  No pain with intercourse.  Urine and bowel movements normal.  Breasts normal.  Health labs with family physician.  History of very small simple right ovarian cyst stable at 1.4 cm on last pelvic ultrasound at [facility].  Past medical history,surgical history, family history and social history were all reviewed and documented in the EPIC chart.  Gynecologic History No LMP recorded (lmp unknown). Patient is postmenopausal. Contraception: post menopausal status Last Pap: 07/2015. Results were: Negative Last mammogram: 02/2017. Results were: Negative Bone Density: 2013 Colonoscopy: 2016  Obstetric History OB History  Gravida Para Term Preterm AB Living  1 1       1   SAB TAB Ectopic Multiple Live Births               # Outcome Date GA Lbr Len/2nd Weight Sex Delivery Anes PTL Lv  1 Para              ROS: A ROS was performed and pertinent positives and negatives are included in the history.  GENERAL: No fevers or chills. HEENT: No change in vision, no earache, sore throat or sinus congestion. NECK: No pain or stiffness. CARDIOVASCULAR: No chest pain or pressure. No palpitations. PULMONARY: No shortness of breath, cough or wheeze. GASTROINTESTINAL: No abdominal pain, nausea, vomiting or diarrhea, melena or bright red blood per rectum. GENITOURINARY: No urinary frequency, urgency, hesitancy or dysuria. MUSCULOSKELETAL: No joint or muscle pain, no back pain, no recent trauma. DERMATOLOGIC: No rash, no itching, no lesions. ENDOCRINE: No polyuria, polydipsia, no heat or cold intolerance. No recent change in weight. HEMATOLOGICAL: No anemia or easy bruising or bleeding. NEUROLOGIC:  No headache, seizures, numbness, tingling or weakness. PSYCHIATRIC: No depression, no loss of interest in normal activity or change in sleep pattern.     Exam:   BP 140/78   Ht 5' 5.5" (1.664 m)   Wt 194 lb (88 kg)   LMP  (LMP Unknown)   BMI 31.79 kg/m   Body mass index is 31.79 kg/m.  General appearance : Well developed well nourished female. No acute distress HEENT: Eyes: no retinal hemorrhage or exudates,  Neck supple, trachea midline, no carotid bruits, no thyroidmegaly Lungs: Clear to auscultation, no rhonchi or wheezes, or rib retractions  Heart: Regular rate and rhythm, no murmurs or gallops Breast:Examined in sitting and supine position were symmetrical in appearance, no palpable masses or tenderness,  no skin retraction, no nipple inversion, no nipple discharge, no skin discoloration, no axillary or supraclavicular lymphadenopathy Abdomen: no palpable masses or tenderness, no rebound or guarding Extremities: no edema or skin discoloration or tenderness  Pelvic: Vulva: Normal             Vagina: No gross lesions or discharge  Cervix: No gross lesions or discharge.  Pap reflex done  Uterus  AV, normal size, shape and consistency, non-tender and mobile  Adnexa  Without masses or tenderness  Anus: Normal   Assessment/Plan:  72 y.o. female for annual exam   1. Encounter for gynecological examination with abnormal finding Normal gynecologic exam in menopause.  History of very small simple right ovarian cyst at 1.4 cm, a symptomatic.  Patient  will schedule a pelvic ultrasound with her annual gynecologic exam next year.  Patient will schedule Pap reflex done.  Breast exam normal.  Last screening mammogram negative in October 2018.  Colonoscopy in 2017.  Health labs with family physician.  -Pelvic US, future, with next annual/gyn exam  2. Screening for malignant neoplasm of cervix - Pap IG w/ reflex to HPV when ASC-U  3. Menopause present Well on no hormone replacement  therapy.  No postmenopausal bleeding.  4. Screening for osteoporosis Vitamin D supplements and calcium supplements, regular weightbearing physical activity to continue.  Will schedule bone density here now. - DG Bone Density; Future  Genia Del MD, 12:23 PM 08/15/2017

## 2017-08-15 NOTE — Patient Instructions (Signed)
1. Encounter for gynecological examination with abnormal finding Normal gynecologic exam in menopause.  History of very small simple right ovarian cyst at 1.4 cm, a symptomatic.  Patient will schedule a pelvic ultrasound with her annual gynecologic exam next year.  Patient will schedule Pap reflex done.  Breast exam normal.  Last screening mammogram negative in October 2018.  Colonoscopy in 2017.  Health labs with family physician.  -Pelvic US, future, with next annual/gyn exam  2. Screening for malignant neoplasm of cervix - Pap IG w/ reflex to HPV when ASC-U  3. Menopause present Well on no hormone replacement therapy.  No postmenopausal bleeding.  4. Screening for osteoporosis Vitamin D supplements and calcium supplements, regular weightbearing physical activity to continue.  Will schedule bone density here now. - DG Bone Density; Future  Bridget Hartman, it was a pleasure seeing you today!  I will inform you of your results as soon as they are available.   Health Maintenance for Postmenopausal Women Menopause is a normal process in which your reproductive ability comes to an end. This process happens gradually over a span of months to years, usually between the ages of 44 and 66. Menopause is complete when you have missed 12 consecutive menstrual periods. It is important to talk with your health care provider about some of the most common conditions that affect postmenopausal women, such as heart disease, cancer, and bone loss (osteoporosis). Adopting a healthy lifestyle and getting preventive care can help to promote your health and wellness. Those actions can also lower your chances of developing some of these common conditions. What should I know about menopause? During menopause, you may experience a number of symptoms, such as:  Moderate-to-severe hot flashes.  Night sweats.  Decrease in sex drive.  Mood swings.  Headaches.  Tiredness.  Irritability.  Memory  problems.  Insomnia.  Choosing to treat or not to treat menopausal changes is an individual decision that you make with your health care provider. What should I know about hormone replacement therapy and supplements? Hormone therapy products are effective for treating symptoms that are associated with menopause, such as hot flashes and night sweats. Hormone replacement carries certain risks, especially as you become older. If you are thinking about using estrogen or estrogen with progestin treatments, discuss the benefits and risks with your health care provider. What should I know about heart disease and stroke? Heart disease, heart attack, and stroke become more likely as you age. This may be due, in part, to the hormonal changes that your body experiences during menopause. These can affect how your body processes dietary fats, triglycerides, and cholesterol. Heart attack and stroke are both medical emergencies. There are many things that you can do to help prevent heart disease and stroke:  Have your blood pressure checked at least every 1-2 years. High blood pressure causes heart disease and increases the risk of stroke.  If you are 40-32 years old, ask your health care provider if you should take aspirin to prevent a heart attack or a stroke.  Do not use any tobacco products, including cigarettes, chewing tobacco, or electronic cigarettes. If you need help quitting, ask your health care provider.  It is important to eat a healthy diet and maintain a healthy weight. ? Be sure to include plenty of vegetables, fruits, low-fat dairy products, and lean protein. ? Avoid eating foods that are high in solid fats, added sugars, or salt (sodium).  Get regular exercise. This is one of the most important things  that you can do for your health. ? Try to exercise for at least 150 minutes each week. The type of exercise that you do should increase your heart rate and make you sweat. This is known as  moderate-intensity exercise. ? Try to do strengthening exercises at least twice each week. Do these in addition to the moderate-intensity exercise.  Know your numbers.Ask your health care provider to check your cholesterol and your blood glucose. Continue to have your blood tested as directed by your health care provider.  What should I know about cancer screening? There are several types of cancer. Take the following steps to reduce your risk and to catch any cancer development as early as possible. Breast Cancer  Practice breast self-awareness. ? This means understanding how your breasts normally appear and feel. ? It also means doing regular breast self-exams. Let your health care provider know about any changes, no matter how small.  If you are 45 or older, have a clinician do a breast exam (clinical breast exam or CBE) every year. Depending on your age, family history, and medical history, it may be recommended that you also have a yearly breast X-ray (mammogram).  If you have a family history of breast cancer, talk with your health care provider about genetic screening.  If you are at high risk for breast cancer, talk with your health care provider about having an MRI and a mammogram every year.  Breast cancer (BRCA) gene test is recommended for women who have family members with BRCA-related cancers. Results of the assessment will determine the need for genetic counseling and BRCA1 and for BRCA2 testing. BRCA-related cancers include these types: ? Breast. This occurs in males or females. ? Ovarian. ? Tubal. This may also be called fallopian tube cancer. ? Cancer of the abdominal or pelvic lining (peritoneal cancer). ? Prostate. ? Pancreatic.  Cervical, Uterine, and Ovarian Cancer Your health care provider may recommend that you be screened regularly for cancer of the pelvic organs. These include your ovaries, uterus, and vagina. This screening involves a pelvic exam, which  includes checking for microscopic changes to the surface of your cervix (Pap test).  For women ages 21-65, health care providers may recommend a pelvic exam and a Pap test every three years. For women ages 85-65, they may recommend the Pap test and pelvic exam, combined with testing for human papilloma virus (HPV), every five years. Some types of HPV increase your risk of cervical cancer. Testing for HPV may also be done on women of any age who have unclear Pap test results.  Other health care providers may not recommend any screening for nonpregnant women who are considered low risk for pelvic cancer and have no symptoms. Ask your health care provider if a screening pelvic exam is right for you.  If you have had past treatment for cervical cancer or a condition that could lead to cancer, you need Pap tests and screening for cancer for at least 20 years after your treatment. If Pap tests have been discontinued for you, your risk factors (such as having a new sexual partner) need to be reassessed to determine if you should start having screenings again. Some women have medical problems that increase the chance of getting cervical cancer. In these cases, your health care provider may recommend that you have screening and Pap tests more often.  If you have a family history of uterine cancer or ovarian cancer, talk with your health care provider about genetic screening.  If you have vaginal bleeding after reaching menopause, tell your health care provider.  There are currently no reliable tests available to screen for ovarian cancer.  Lung Cancer Lung cancer screening is recommended for adults 62-4 years old who are at high risk for lung cancer because of a history of smoking. A yearly low-dose CT scan of the lungs is recommended if you:  Currently smoke.  Have a history of at least 30 pack-years of smoking and you currently smoke or have quit within the past 15 years. A pack-year is smoking an  average of one pack of cigarettes per day for one year.  Yearly screening should:  Continue until it has been 15 years since you quit.  Stop if you develop a health problem that would prevent you from having lung cancer treatment.  Colorectal Cancer  This type of cancer can be detected and can often be prevented.  Routine colorectal cancer screening usually begins at age 62 and continues through age 88.  If you have risk factors for colon cancer, your health care provider may recommend that you be screened at an earlier age.  If you have a family history of colorectal cancer, talk with your health care provider about genetic screening.  Your health care provider may also recommend using home test kits to check for hidden blood in your stool.  A small camera at the end of a tube can be used to examine your colon directly (sigmoidoscopy or colonoscopy). This is done to check for the earliest forms of colorectal cancer.  Direct examination of the colon should be repeated every 5-10 years until age 76. However, if early forms of precancerous polyps or small growths are found or if you have a family history or genetic risk for colorectal cancer, you may need to be screened more often.  Skin Cancer  Check your skin from head to toe regularly.  Monitor any moles. Be sure to tell your health care provider: ? About any new moles or changes in moles, especially if there is a change in a mole's shape or color. ? If you have a mole that is larger than the size of a pencil eraser.  If any of your family members has a history of skin cancer, especially at a young age, talk with your health care provider about genetic screening.  Always use sunscreen. Apply sunscreen liberally and repeatedly throughout the day.  Whenever you are outside, protect yourself by wearing long sleeves, pants, a wide-brimmed hat, and sunglasses.  What should I know about osteoporosis? Osteoporosis is a condition in  which bone destruction happens more quickly than new bone creation. After menopause, you may be at an increased risk for osteoporosis. To help prevent osteoporosis or the bone fractures that can happen because of osteoporosis, the following is recommended:  If you are 51-43 years old, get at least 1,000 mg of calcium and at least 600 mg of vitamin D per day.  If you are older than age 37 but younger than age 38, get at least 1,200 mg of calcium and at least 600 mg of vitamin D per day.  If you are older than age 49, get at least 1,200 mg of calcium and at least 800 mg of vitamin D per day.  Smoking and excessive alcohol intake increase the risk of osteoporosis. Eat foods that are rich in calcium and vitamin D, and do weight-bearing exercises several times each week as directed by your health care provider. What should  I know about how menopause affects my mental health? Depression may occur at any age, but it is more common as you become older. Common symptoms of depression include:  Low or sad mood.  Changes in sleep patterns.  Changes in appetite or eating patterns.  Feeling an overall lack of motivation or enjoyment of activities that you previously enjoyed.  Frequent crying spells.  Talk with your health care provider if you think that you are experiencing depression. What should I know about immunizations? It is important that you get and maintain your immunizations. These include:  Tetanus, diphtheria, and pertussis (Tdap) booster vaccine.  Influenza every year before the flu season begins.  Pneumonia vaccine.  Shingles vaccine.  Your health care provider may also recommend other immunizations. This information is not intended to replace advice given to you by your health care provider. Make sure you discuss any questions you have with your health care provider. Document Released: 07/01/2005 Document Revised: 11/27/2015 Document Reviewed: 02/10/2015 Elsevier Interactive  Patient Education  2018 [employer].

## 2017-08-16 LAB — PAP IG W/ RFLX HPV ASCU

## 2017-08-18 ENCOUNTER — Encounter: Payer: Self-pay | Admitting: Cardiovascular Disease

## 2017-08-29 ENCOUNTER — Other Ambulatory Visit: Payer: Self-pay | Admitting: Gynecology

## 2017-08-29 DIAGNOSIS — Z780 Asymptomatic menopausal state: Secondary | ICD-10-CM

## 2017-09-06 ENCOUNTER — Ambulatory Visit: Payer: Self-pay | Admitting: Cardiovascular Disease

## 2017-09-12 ENCOUNTER — Ambulatory Visit (INDEPENDENT_AMBULATORY_CARE_PROVIDER_SITE_OTHER): Payer: Medicare Other

## 2017-09-12 ENCOUNTER — Encounter: Payer: Self-pay | Admitting: Gynecology

## 2017-09-12 DIAGNOSIS — Z780 Asymptomatic menopausal state: Secondary | ICD-10-CM

## 2017-09-21 ENCOUNTER — Encounter: Payer: Self-pay | Admitting: Cardiovascular Disease

## 2017-09-21 ENCOUNTER — Ambulatory Visit: Payer: Medicare Other | Admitting: Cardiovascular Disease

## 2017-09-21 VITALS — BP 154/84 | HR 91 | Ht 65.5 in | Wt 187.2 lb

## 2017-09-21 DIAGNOSIS — R011 Cardiac murmur, unspecified: Secondary | ICD-10-CM | POA: Diagnosis not present

## 2017-09-21 DIAGNOSIS — R0600 Dyspnea, unspecified: Secondary | ICD-10-CM | POA: Diagnosis not present

## 2017-09-21 DIAGNOSIS — R079 Chest pain, unspecified: Secondary | ICD-10-CM

## 2017-09-21 DIAGNOSIS — I10 Essential (primary) hypertension: Secondary | ICD-10-CM | POA: Diagnosis not present

## 2017-09-21 NOTE — Patient Instructions (Addendum)
Medication Instructions:  Your physician recommends that you continue on your current medications as directed. Please refer to the Current Medication list given to you today.  Labwork: NONE  Testing/Procedures: Your physician has requested that you have a vascular screening. This test is an ultrasound of the arteries in the legs and arms. It looks at arterial blood flow in the legs and arms. Allow one hour for Lower and Upper Arterial scans. There are no restrictions or special instructions  Follow-Up: Your physician wants you to follow-up in: 3 months with Dr. [name].    If you need a refill on your cardiac medications before your next appointment, please call your pharmacy.

## 2017-09-21 NOTE — Progress Notes (Signed)
Patient ID: IVETT LUEBBE, female   DOB: November 22, 1945, 72 y.o.   MRN: [id number]     Cardiology Office Note   Date:  09/21/2017   ID:  CHERYAL SALAS, DOB [birth year], MRN [id number]  PCP:  [facility] Marchelle Gearing NP   Cardiologist:   Charlton Haws, MD   No chief complaint on file.     History of Present Illness:  72 y.o. with history of atypical chest pain Calcium score 2007 was 0. 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 She complains of fleeting tightness in chest when going up hills but not swimming Associated With some dyspnea     Past Medical History:  Diagnosis Date  . Chest pain   . Dyspnea   . Hypertension   . Impaired fasting glucose   . Shingles   . Subclinical hypothyroidism   . Thyroid disease     Past Surgical History:  Procedure Laterality Date  . KNEE ARTHROSCOPY Left   . LUNG REMOVAL, PARTIAL    . VEIN LIGATION AND STRIPPING    . WRIST SURGERY Left      Current Outpatient Medications  Medication Sig Dispense Refill  . atorvastatin (LIPITOR) 10 MG tablet Take 10 mg by mouth daily.    . Calcium Carb-Cholecalciferol (CALCIUM 600 + D PO) Take 1 tablet by mouth daily.    Marland Kitchen co-enzyme Q-10 30 MG capsule Take 30 mg by mouth 3 (three) times daily.    Marland Kitchen HAWTHORNE PO Take by mouth as directed.    . hydrochlorothiazide (HYDRODIURIL) 25 MG tablet Take 25 mg by mouth daily.    . Multiple Vitamins-Minerals (MULTIVITAMIN WITH MINERALS) tablet Take 1 tablet by mouth daily.    . Omega-3 Fatty Acids (FISH OIL TRIPLE STRENGTH) 1400 MG CAPS Take 1 capsule by mouth daily.     No current facility-administered medications for this visit.     Allergies:   Patient has no known allergies.    Social History:  The  patient  reports that she has never smoked. She has never used smokeless tobacco. She reports that she drinks alcohol.   Family History:  The patient's family history includes Atrial fibrillation in her father; Breast cancer in her mother; Cervical cancer in her sister; Colon cancer in her mother; Diabetes in her paternal grandfather; Diabetes type II in her sister; Gallbladder disease in her paternal grandfather; Heart attack in her paternal grandfather; Heart disease in her paternal grandmother; Hypertension in her mother; Thyroid disease in her mother.    ROS:  Please see the history of present illness.   Otherwise, review of systems are positive for none.   All other systems are reviewed and negative.    PHYSICAL EXAM: VS:  BP (!) 154/84   Pulse 91   Ht 5' 5.5" (1.664 m)   Wt 187 lb 3.2 oz (84.9 kg)   LMP  (LMP Unknown)   SpO2 96%   BMI 30.68 kg/m  , BMI Body mass index is 30.68 kg/m. Affect appropriate Healthy:  appears stated age HEENT: normal Neck supple with no adenopathy JVP normal no bruits no thyromegaly Lungs clear with no wheezing and good diaphragmatic motion Heart:  S1/S2 SEM  murmur, no rub, gallop or click  PMI normal Abdomen: benighn, BS positve, no tenderness, no AAA no bruit.  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    Lipid Panel No results found for: CHOL, TRIG, HDL, CHOLHDL, VLDL, LDLCALC, LDLDIRECT    Wt Readings from Last 3 Encounters:  09/21/17 187 lb 3.2 oz (84.9 kg)  08/15/17 194 lb (88 kg)  06/23/16 184 lb 3.2 oz (83.6 kg)      Other studies Reviewed: Additional studies/ records that were reviewed today include: Epic notes Primary records . Echo and myovue from  2016     ASSESSMENT AND PLAN:  1. Chest Pain:  Atypical normal myovue in 2016 observe consider f/u cardiac CTA  2. Dyspnea:  Seems functional  Echo 04/20/15 normal EF normal PA pressure and no valve disease  3. Murmur : benign no valve disease on echo  4. HTN:  Managed by primary Dr Clelia Croft   5. 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 Bifasicular block ICRBBB/LAFB stable f/u ECG in a year    Current medicines are reviewed at length with the patient today.  The patient does not have concerns regarding medicines.  The following changes have been made:  no change  Labs/ tests ordered today include: Vascular Bundle   Orders Placed This Encounter  Procedures  . EKG 12-Lead     Disposition:   FU with me in 3 months      Signed, Charlton Haws, MD  09/21/2017 4:42 PM    [facility] [address] [place], [place], [place]  [address] Phone: [phone]; Fax: [phone]

## 2018-01-22 NOTE — Progress Notes (Signed)
Patient ID: Bridget Hartman, female   DOB: [birth year], 72 y.o.   MRN: [id number]     Cardiology Office Note   Date:  01/25/2018   ID:  Bridget Hartman, DOB [birth year], MRN [id number]  PCP:  [facility] Bridget Gearing NP   Cardiologist:   Charlton Haws, MD   No chief complaint on file.     History of Present Illness:  72 y.o. with history of atypical chest pain Calcium score 2007 was 0. 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 Chest pain   . Dyspnea   . Hypertension   . Impaired fasting glucose   . Shingles   . Subclinical hypothyroidism   . Thyroid disease     Past Surgical History:  Procedure Laterality Date  . KNEE ARTHROSCOPY Left   . LUNG REMOVAL, PARTIAL    . VEIN LIGATION AND STRIPPING    . WRIST SURGERY Left      Current Outpatient Medications  Medication Sig Dispense Refill  . amLODipine (NORVASC) 5 MG tablet Take 5 mg by mouth daily.    Marland Kitchen atorvastatin (LIPITOR) 10 MG tablet Take 10 mg by mouth daily.    . Calcium Carb-Cholecalciferol (CALCIUM 600 + D PO) Take 1 tablet by mouth daily.    Marland Kitchen co-enzyme Q-10 30 MG capsule Take 30 mg by mouth 3 (three) times daily.    Marland Kitchen HAWTHORNE PO Take by mouth as directed.    . hydrochlorothiazide (HYDRODIURIL) 25 MG tablet Take 25 mg by mouth daily.    . Multiple Vitamins-Minerals (MULTIVITAMIN WITH MINERALS) tablet Take 1 tablet by  mouth daily.    . Omega-3 Fatty Acids (FISH OIL TRIPLE STRENGTH) 1400 MG CAPS Take 1 capsule by mouth daily.     No current facility-administered medications for this visit.     Allergies:   Patient has no known allergies.    Social History:  The patient  reports that she has never smoked. She has never used smokeless tobacco. She reports that she drinks alcohol.   Family History:  The patient's family history includes Atrial fibrillation in her father; Breast cancer in her mother; Cervical cancer in her sister; Colon cancer in her mother; Diabetes in her paternal grandfather; Diabetes type II in her sister; Gallbladder disease in her paternal grandfather; Heart attack in her paternal grandfather; Heart disease in her paternal grandmother; Hypertension in her mother; Thyroid disease in her mother.    ROS:  Please see the history of present illness.   Otherwise, review of systems are positive for none.   All other systems are reviewed and negative.    PHYSICAL EXAM: VS:  BP 138/78   Pulse 78   Ht 5' 5.5" (1.664 m)   Wt 193 lb 8 oz (87.8 kg)   LMP  (LMP Unknown)  SpO2 94%   BMI 31.71 kg/m  , BMI Body mass index is 31.71 kg/m. Affect appropriate Healthy:  appears stated age HEENT: normal Neck supple with no adenopathy JVP normal no bruits no thyromegaly Lungs clear with no wheezing and good diaphragmatic motion previous LLL lung resection  Heart:  S1/S2 no murmur, no rub, gallop or click PMI normal Abdomen: benighn, BS positve, no tenderness, no AAA no bruit.  No HSM or HJR Distal pulses intact with no bruits No edema Neuro non-focal Skin warm and dry No muscular weakness Scoliosis     EKG:   03/24/15  SR rate 84  ICRBBB LAD poor R wave progression  06/23/16  SR rate 94 RBBB LAFB  09/21/17 SR rate 81 ICRBBB LAFB   Recent Labs: No results found for requested labs within last 8760 hours.    Lipid Panel No results found for: CHOL, TRIG, HDL, CHOLHDL, VLDL, LDLCALC,  LDLDIRECT    Wt Readings from Last 3 Encounters:  01/25/18 193 lb 8 oz (87.8 kg)  09/21/17 187 lb 3.2 oz (84.9 kg)  08/15/17 194 lb (88 kg)      Other studies Reviewed: Additional studies/ records that were reviewed today include: Epic notes Primary records . Echo and myovue from 2016     ASSESSMENT AND PLAN:  1. Chest Pain:  Atypical normal myovue in 2016 observe consider f/u cardiac CTA  2. Dyspnea:  Seems functional  Echo 04/20/15 normal EF normal PA pressure and no valve disease see #5 below  3. Murmur : benign no valve disease on echo  4. HTN:  Managed by primary Dr Bridget Hartman  On norvasc now  5. Lung Cacner:  Distant LLL resection contributes to dyspnea lungs clear on exam    She indicated resection age 56 for cyst CXR 06/2016 6. Bifasicular block ICRBBB/LAFB stable f/u ECG in a year    Current medicines are reviewed at length with the patient today.  The patient does not have concerns regarding medicines.  The following changes have been made:  no change  Labs/ tests ordered today include: none   No orders of the defined types were placed in this encounter.    Disposition:   FU with me in a year     Signed, Charlton Haws, MD  01/25/2018 4:15 PM    [facility] [address] [place], [place], [place]  [address] Phone: [phone]; Fax: [phone]

## 2018-01-23 ENCOUNTER — Telehealth: Payer: Self-pay

## 2018-01-23 ENCOUNTER — Ambulatory Visit (HOSPITAL_COMMUNITY)
Admission: RE | Admit: 2018-01-23 | Discharge: 2018-01-23 | Disposition: A | Discharge status: Home / Self Care | Payer: Medicare Other | Source: Ambulatory Visit | Attending: Cardiology | Admitting: Cardiology

## 2018-01-23 DIAGNOSIS — R011 Cardiac murmur, unspecified: Secondary | ICD-10-CM

## 2018-01-23 DIAGNOSIS — R079 Chest pain, unspecified: Secondary | ICD-10-CM

## 2018-01-23 DIAGNOSIS — I10 Essential (primary) hypertension: Secondary | ICD-10-CM

## 2018-01-23 DIAGNOSIS — E785 Hyperlipidemia, unspecified: Secondary | ICD-10-CM

## 2018-01-23 DIAGNOSIS — R0600 Dyspnea, unspecified: Secondary | ICD-10-CM

## 2018-01-23 NOTE — Telephone Encounter (Signed)
-----   Message from Wendall Stade, MD sent at 01/23/2018 11:46 AM EDT ----- Plaque no stenosis f/u carotid duplex in 2 years

## 2018-01-23 NOTE — Telephone Encounter (Signed)
Patient aware of results. Per Dr. Nishan, Plaque no stenosis f/u carotid duplex in 2 years. Patient verbalized understanding.  

## 2018-01-25 ENCOUNTER — Ambulatory Visit: Payer: Medicare Other | Admitting: Cardiovascular Disease

## 2018-01-25 ENCOUNTER — Ambulatory Visit: Payer: Self-pay | Admitting: Cardiovascular Disease

## 2018-01-25 ENCOUNTER — Encounter: Payer: Self-pay | Admitting: Cardiovascular Disease

## 2018-01-25 ENCOUNTER — Encounter: Payer: Medicare Other | Admitting: Obstetrics & Gynecology

## 2018-01-25 VITALS — BP 138/78 | HR 78 | Ht 65.5 in | Wt 193.5 lb

## 2018-01-25 DIAGNOSIS — I10 Essential (primary) hypertension: Secondary | ICD-10-CM | POA: Diagnosis not present

## 2018-01-25 DIAGNOSIS — R0600 Dyspnea, unspecified: Secondary | ICD-10-CM

## 2018-01-25 DIAGNOSIS — R079 Chest pain, unspecified: Secondary | ICD-10-CM | POA: Diagnosis not present

## 2018-01-25 NOTE — Patient Instructions (Addendum)

## 2018-02-06 ENCOUNTER — Other Ambulatory Visit: Payer: Self-pay | Admitting: Obstetrics & Gynecology

## 2018-02-06 DIAGNOSIS — Z1231 Encounter for screening mammogram for malignant neoplasm of breast: Secondary | ICD-10-CM

## 2018-03-14 ENCOUNTER — Ambulatory Visit
Admission: RE | Admit: 2018-03-14 | Discharge: 2018-03-14 | Disposition: A | Discharge status: Home / Self Care | Payer: Medicare Other | Source: Ambulatory Visit | Attending: Obstetrics & Gynecology | Admitting: Obstetrics & Gynecology

## 2018-03-14 DIAGNOSIS — Z1231 Encounter for screening mammogram for malignant neoplasm of breast: Secondary | ICD-10-CM

## 2018-07-20 ENCOUNTER — Encounter: Payer: Self-pay | Admitting: Internal Medicine

## 2018-07-20 ENCOUNTER — Ambulatory Visit (INDEPENDENT_AMBULATORY_CARE_PROVIDER_SITE_OTHER): Payer: Medicare Other | Admitting: Internal Medicine

## 2018-07-20 DIAGNOSIS — Z23 Encounter for immunization: Secondary | ICD-10-CM

## 2018-07-20 DIAGNOSIS — Z7184 Encounter for health counseling related to travel: Secondary | ICD-10-CM

## 2018-07-20 DIAGNOSIS — Z7189 Other specified counseling: Secondary | ICD-10-CM

## 2018-07-20 DIAGNOSIS — Z9189 Other specified personal risk factors, not elsewhere classified: Secondary | ICD-10-CM

## 2018-07-20 DIAGNOSIS — Z7185 Encounter for immunization safety counseling: Secondary | ICD-10-CM

## 2018-07-20 NOTE — Progress Notes (Signed)
Subjective:   Bridget Hartman is a 73 y.o. female who presents to the Infectious Disease clinic for travel consultation. Planned departure date: March 2020       Planned return date: 3 weeks Countries of travel: [place], [place] and [place] and [place] Areas in country: did not discuss   Accommodations: did not discuss Purpose of travel: vacation Prior travel out of [place]: yes     Objective:   Medications: reviewed    Assessment:   No contraindications to travel. none    Plan:    Issues discussed: she did not want to discuss any travel-related issues. Immunizations recommended: Hepatitis A series and Td. - she received hepatitis A #2 and hepatitis B #1 by her request.  She did not want to discuss other vaccine needs.  Hepatitis B is relatively indicated and no contraindication.   Malaria prophylaxis: malarone, daily dose starting 1-2 days before entering endemic area, ending 7 days after leaving area she already has prescriptions for malarone, azithromycin and zofran, by her report. She did not want to discuss appropriate use.  Traveler's diarrhea prophylaxis: azithromycin. She did not need or want a prescription from me Total duration of visit: 1 Hour. Total time spent on education, counseling, coordination of care: 15 Minutes.  All questions anwered

## 2018-07-20 NOTE — Patient Instructions (Signed)
[facility]                [address], [address]                   [place], [place] [address]                      Phone: [phone]                        Fax: [phone]   Planned departure date: March 2020     Planned return date: 3 weeks Countries of travel: [place], [place] and [place] and [place]   Guidelines for the Prevention & Treatment of Traveler's Diarrhea  Prevention: "Boil it, Peel it, [place] it, or Forget it"   the fewer chances -> lower risk: try to stick to food & water precautions as much as possible"   If it's "piping hot"; it is probably okay, if not, it may not be   Treatment   1) You should always take care to drink lots of fluids in order to avoid dehydration   2) You should bring medications with you in case you come down with a case of diarrhea   3) OTC = bring pepto-bismol - can take with initial abdominal symptoms;                    Imodium - can help slow down your intestinal tract, can help relief cramps                    and diarrhea, can take if no bloody diarrhea  Use azithromycin if needed for traveler's diarrhea  Guidelines for the Prevention of Malaria  Avoidance:  -fewer mosquito bites = lower risk. Mosquitos can bite at night as well as daytime  -cover up (long sleeve clothing), mosquito nets, screens  -Insect repellent for your skin ( DEET containing lotion > 20%): for clothes ( permethrin spray) www.insectshield.com 307-515-9359) for pretreated permethrin  2 days prior to travel, start malarone, daily dose starting 1-2 days before entering endemic area, ending 7 days after leaving area for malaria prevention.   Immunizations received today: Hepatitis A series and Hepatitis B  Future immunizations, if indicated Hepatitis B in 1 month and 6 months   Prior to travel:  1) Be sure to pick up appropriate prescriptions, including medicine you take daily. Do not expect to be able to  fill your prescriptions abroad.  2) Strongly consider obtaining traveler's insurance, including emergency evacuation insurance. Most plans in the [place] do not cover participants abroad. (see below for resources)  3) Register at the appropriate [place] embassy or consulate with travel dates so they are aware of your presence in-country and for helpful advice during travel using the [employer] (STEP, GuyGalaxy.si).  4) Leave contact information with a relative or friend.  5) Keep a Corporate treasurer, credit cards in case they become lost or stolen  6) Inform your credit card company that you will be travelling abroad   During travel:  1) If you become ill and need medical advice, the [place] [employer] of the country you are traveling in general provides a list of English speaking doctors.  We are also available on MyChart for remote consultation if you register prior to travel. 2) Avoid motorcycles or scooters when at all possible. Traffic  laws in many countries are lax and accidents occur frequently.  3) Do not take any unnecessary risks that you wouldn't do at home.   Resources:  -Country specific information: http://www.church.org/ or GuyGalaxy.si  -Chief Strategy Officer (DEET, mosquito nets): [employer], [employer] store, [employer], [employer]  -Travel insurance options: gatewayplans.com; FixItGel.es; travelguard.com or Good [employer]el, gninsurance.com or info@gninsurance .com, 9167045053.   Post Travel:  If you return from your trip ill, call your primary care doctor or our travel clinic @ [phone].   Enjoy your trip and know that with proper pre-travel preparation, most people have an enjoyable and uninterrupted trip!

## 2018-09-05 ENCOUNTER — Other Ambulatory Visit: Payer: Self-pay | Admitting: *Deleted

## 2018-09-05 DIAGNOSIS — N83209 Unspecified ovarian cyst, unspecified side: Secondary | ICD-10-CM

## 2018-09-10 ENCOUNTER — Encounter: Payer: Medicare Other | Admitting: Obstetrics & Gynecology

## 2018-09-10 ENCOUNTER — Other Ambulatory Visit: Payer: Medicare Other

## 2018-10-18 ENCOUNTER — Encounter: Payer: Medicare Other | Admitting: Obstetrics & Gynecology

## 2018-10-18 ENCOUNTER — Other Ambulatory Visit: Payer: Medicare Other

## 2019-01-31 ENCOUNTER — Encounter: Payer: Medicare Other | Admitting: Obstetrics & Gynecology

## 2019-01-31 ENCOUNTER — Other Ambulatory Visit: Payer: Medicare Other

## 2019-02-28 ENCOUNTER — Other Ambulatory Visit: Payer: Self-pay | Admitting: Internal Medicine

## 2019-02-28 ENCOUNTER — Encounter: Payer: Self-pay | Admitting: Gynecology

## 2019-02-28 DIAGNOSIS — Z1231 Encounter for screening mammogram for malignant neoplasm of breast: Secondary | ICD-10-CM

## 2019-03-06 ENCOUNTER — Other Ambulatory Visit: Payer: Self-pay

## 2019-03-07 ENCOUNTER — Ambulatory Visit: Payer: Medicare Other | Admitting: Obstetrics & Gynecology

## 2019-03-07 ENCOUNTER — Ambulatory Visit (INDEPENDENT_AMBULATORY_CARE_PROVIDER_SITE_OTHER): Payer: Medicare Other

## 2019-03-07 ENCOUNTER — Encounter: Payer: Self-pay | Admitting: Obstetrics & Gynecology

## 2019-03-07 VITALS — BP 120/82 | Ht 65.0 in | Wt 174.0 lb

## 2019-03-07 DIAGNOSIS — Z01419 Encounter for gynecological examination (general) (routine) without abnormal findings: Secondary | ICD-10-CM | POA: Diagnosis not present

## 2019-03-07 DIAGNOSIS — Z780 Asymptomatic menopausal state: Secondary | ICD-10-CM | POA: Diagnosis not present

## 2019-03-07 DIAGNOSIS — N854 Malposition of uterus: Secondary | ICD-10-CM | POA: Diagnosis not present

## 2019-03-07 DIAGNOSIS — N83201 Unspecified ovarian cyst, right side: Secondary | ICD-10-CM

## 2019-03-07 DIAGNOSIS — N83209 Unspecified ovarian cyst, unspecified side: Secondary | ICD-10-CM

## 2019-03-07 NOTE — Progress Notes (Signed)
Bridget Hartman 13-Jan-1946 [id number]   History:    73 y.o. G1P1L1 Married.  Has Postponed her trip to [place] 3 times.  RP:  Established  patient presenting for annual gyn exam and Pelvic US  HPI: Postmenopause, well on no HRT.  No PMB.  Occasional "twinges" in the Rt pelvic area.  Pelvic US today shows a stable simple Rt ovarian cyst at 1.6 cm.  No pain with intercourse.  Urine and bowel movements normal.  Breasts normal.  Body mass index 28.96.  Walking.  Health labs with family physician.  Past medical history,surgical history, family history and social history were all reviewed and documented in the EPIC chart.  Gynecologic History No LMP recorded (lmp unknown). Patient is postmenopausal. Contraception: post menopausal status Last Pap: 07/2017. Results were: Negative Last mammogram: 02/2018. Results were: Negative Bone Density: 08/2017 Normal Colonoscopy: 2017  Obstetric History OB History  Gravida Para Term Preterm AB Living  1 1       1   SAB TAB Ectopic Multiple Live Births               # Outcome Date GA Lbr Len/2nd Weight Sex Delivery Anes PTL Lv  1 Para              ROS: A ROS was performed and pertinent positives and negatives are included in the history.  GENERAL: No fevers or chills. HEENT: No change in vision, no earache, sore throat or sinus congestion. NECK: No pain or stiffness. CARDIOVASCULAR: No chest pain or pressure. No palpitations. PULMONARY: No shortness of breath, cough or wheeze. GASTROINTESTINAL: No abdominal pain, nausea, vomiting or diarrhea, melena or bright red blood per rectum. GENITOURINARY: No urinary frequency, urgency, hesitancy or dysuria. MUSCULOSKELETAL: No joint or muscle pain, no back pain, no recent trauma. DERMATOLOGIC: No rash, no itching, no lesions. ENDOCRINE: No polyuria, polydipsia, no heat or cold intolerance. No recent change in weight. HEMATOLOGICAL: No anemia or easy bruising or bleeding. NEUROLOGIC: No headache, seizures, numbness,  tingling or weakness. PSYCHIATRIC: No depression, no loss of interest in normal activity or change in sleep pattern.     Exam:   Ht 5\' 5"  (1.651 m)   Wt 174 lb (78.9 kg)   LMP  (LMP Unknown)   BMI 28.96 kg/m   Body mass index is 28.96 kg/m.  General appearance : Well developed well nourished female. No acute distress HEENT: Eyes: no retinal hemorrhage or exudates,  Neck supple, trachea midline, no carotid bruits, no thyroidmegaly Lungs: Clear to auscultation, no rhonchi or wheezes, or rib retractions  Heart: Regular rate and rhythm, no murmurs or gallops Breast:Examined in sitting and supine position were symmetrical in appearance, no palpable masses or tenderness,  no skin retraction, no nipple inversion, no nipple discharge, no skin discoloration, no axillary or supraclavicular lymphadenopathy Abdomen: no palpable masses or tenderness, no rebound or guarding Extremities: no edema or skin discoloration or tenderness  Pelvic: Vulva: Normal             Vagina: No gross lesions or discharge  Cervix: No gross lesions or discharge  Uterus  AV, normal size, shape and consistency, non-tender and mobile  Adnexa  Without masses or tenderness  Anus: Normal  Pelvic today: Anteverted uterus, normal size and shape with no myometrial mass.  The uterus measures 5.67 x 3.42 x 2.75 cm.  The endometrial lining is thin at 2.63 mm with no mass or thickening seen.  The left ovary is small and  atrophic.  The left ovary shows a simple appearing cystic structure measuring 1.6 x 1.4 x 1.2 cm.  Stable compared to the pelvic ultrasound done in 2017 or 18.  No adnexal mass.  No free fluid in the posterior cul-de-sac.   Assessment/Plan:  73 y.o. female for annual exam   1. Well female exam with routine gynecological exam Normal gynecologic exam in menopause.  Pap test October 2019 was negative, no indication to repeat this year.  Breast exam normal.  Screening mammogram October 2019 was negative.  Patient  will schedule a screening mammogram this year.  Colonoscopy in 2017.  Health labs with family physician.  Body mass index at 28.96.  Continue with fitness and healthy nutrition.  2. Postmenopause Well on no hormone replacement therapy.  No postmenopausal bleeding.  Bone density was normal in April 2019.  Continue with vitamin D supplements, calcium intake of 1200 mg daily and regular weightbearing physical activities.    3. Right ovarian cyst Pelvic ultrasound findings reviewed with patient.  Patient reassured that the right ovarian cyst is very small, simple and stable.  We will repeat pelvic ultrasound every 1 to 2 years.  Counseling on above issues and coordination of care more than 50% for 15 minutes.  Princess Bruins MD, 2:51 PM 03/07/2019

## 2019-03-15 ENCOUNTER — Encounter: Payer: Self-pay | Admitting: Obstetrics & Gynecology

## 2019-03-15 NOTE — Patient Instructions (Signed)
1. Well female exam with routine gynecological exam Normal gynecologic exam in menopause.  Pap test October 2019 was negative, no indication to repeat this year.  Breast exam normal.  Screening mammogram October 2019 was negative.  Patient will schedule a screening mammogram this year.  Colonoscopy in 2017.  Health labs with family physician.  Body mass index at 28.96.  Continue with fitness and healthy nutrition.  2. Postmenopause Well on no hormone replacement therapy.  No postmenopausal bleeding.  Bone density was normal in April 2019.  Continue with vitamin D supplements, calcium intake of 1200 mg daily and regular weightbearing physical activities.    3. Right ovarian cyst Pelvic ultrasound findings reviewed with patient.  Patient reassured that the right ovarian cyst is very small, simple and stable.  We will repeat pelvic ultrasound every 1 to 2 years.  Bridget Hartman, it was a pleasure seeing you today!

## 2019-04-16 ENCOUNTER — Ambulatory Visit
Admission: RE | Admit: 2019-04-16 | Discharge: 2019-04-16 | Disposition: A | Discharge status: Home / Self Care | Payer: Medicare Other | Source: Ambulatory Visit | Attending: Internal Medicine | Admitting: Internal Medicine

## 2019-04-16 ENCOUNTER — Other Ambulatory Visit: Payer: Self-pay

## 2019-04-16 DIAGNOSIS — Z1231 Encounter for screening mammogram for malignant neoplasm of breast: Secondary | ICD-10-CM

## 2020-01-03 ENCOUNTER — Encounter: Payer: Self-pay | Admitting: Genetic Counselor

## 2020-01-13 IMAGING — MG DIGITAL SCREENING BILAT W/ TOMO W/ CAD
8 series · 8 of 24 positions shown · non-contrast
Comparison: Previous exam(s).

CLINICAL DATA: Screening.

EXAM:
DIGITAL SCREENING BILATERAL MAMMOGRAM WITH TOMO AND CAD

[L MLO synth-2D]
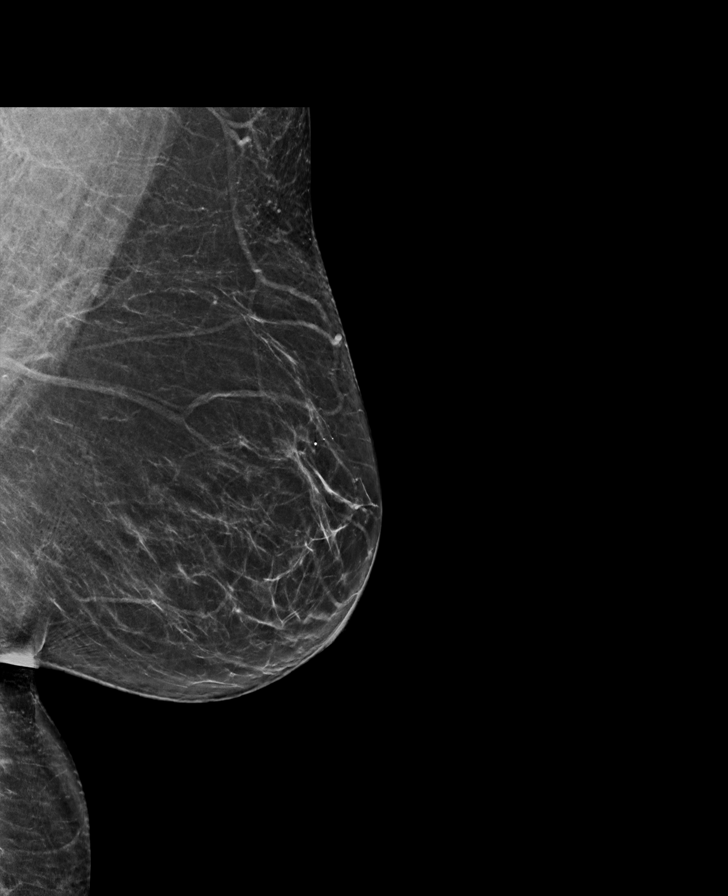

[L CC synth-2D]
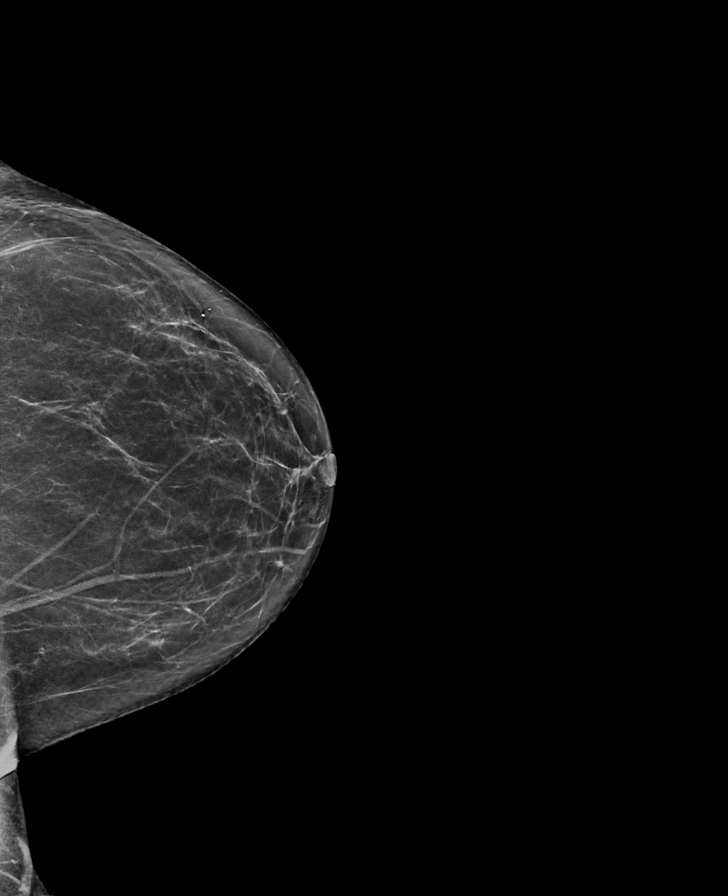

[R CC synth-2D]
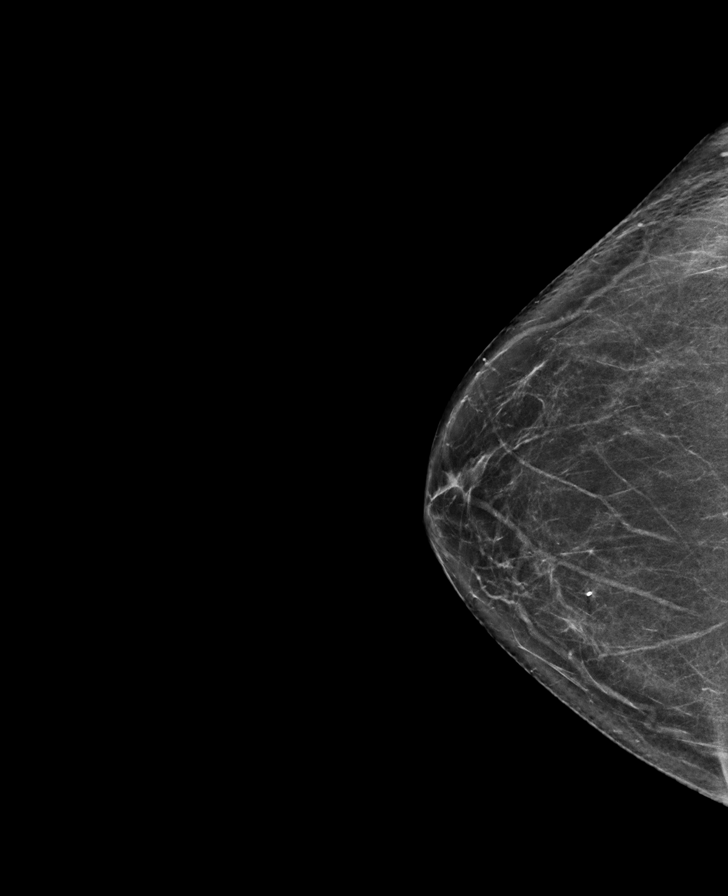

[R MLO synth-2D]
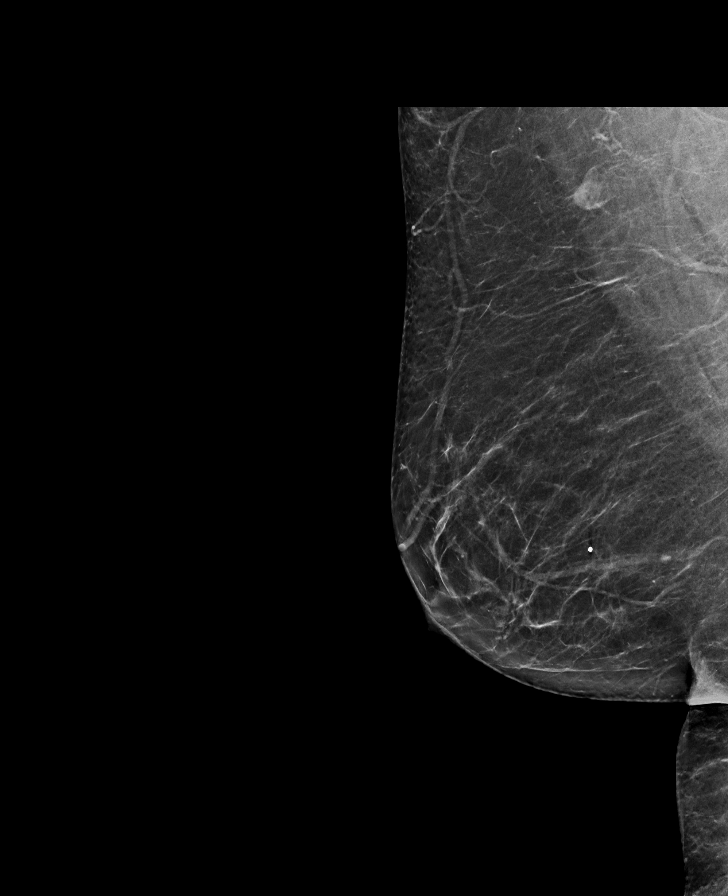

[L MLO tomo · tomo slice 37/73.0]
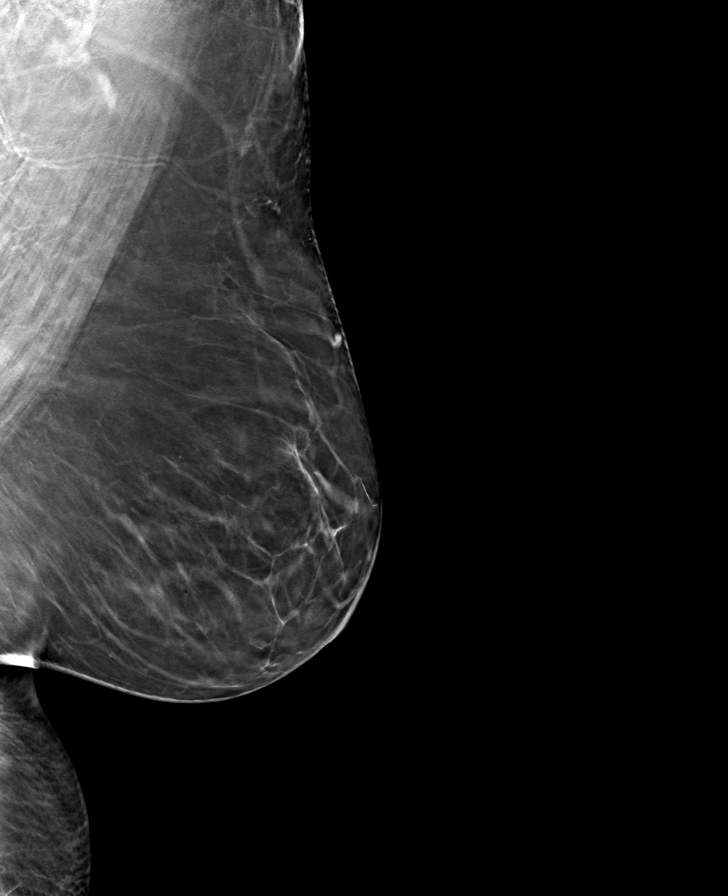

[L CC tomo · tomo slice 33/66.0]
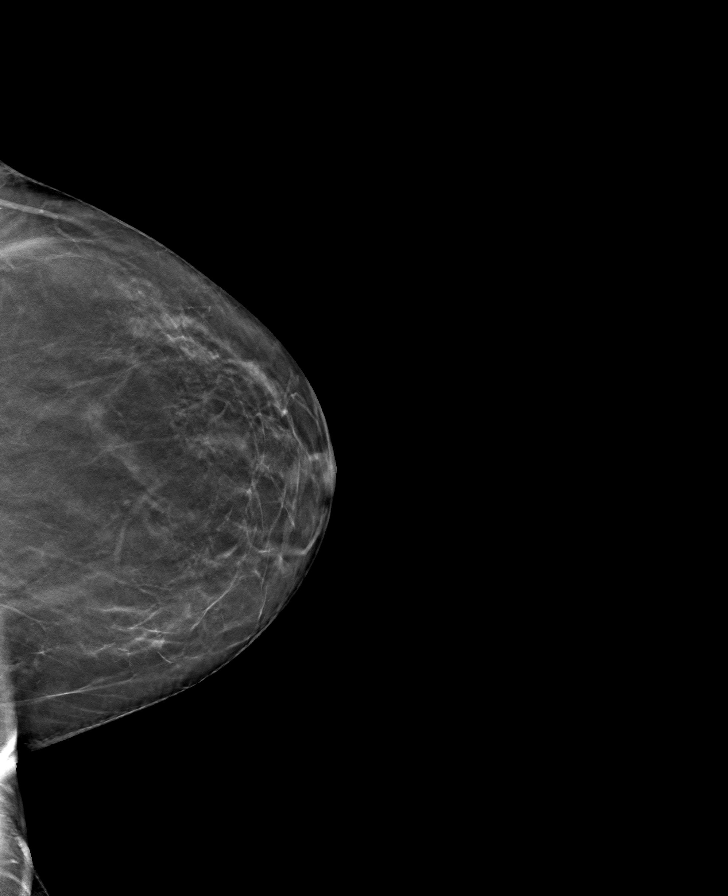

[R MLO tomo · tomo slice 35/70.0]
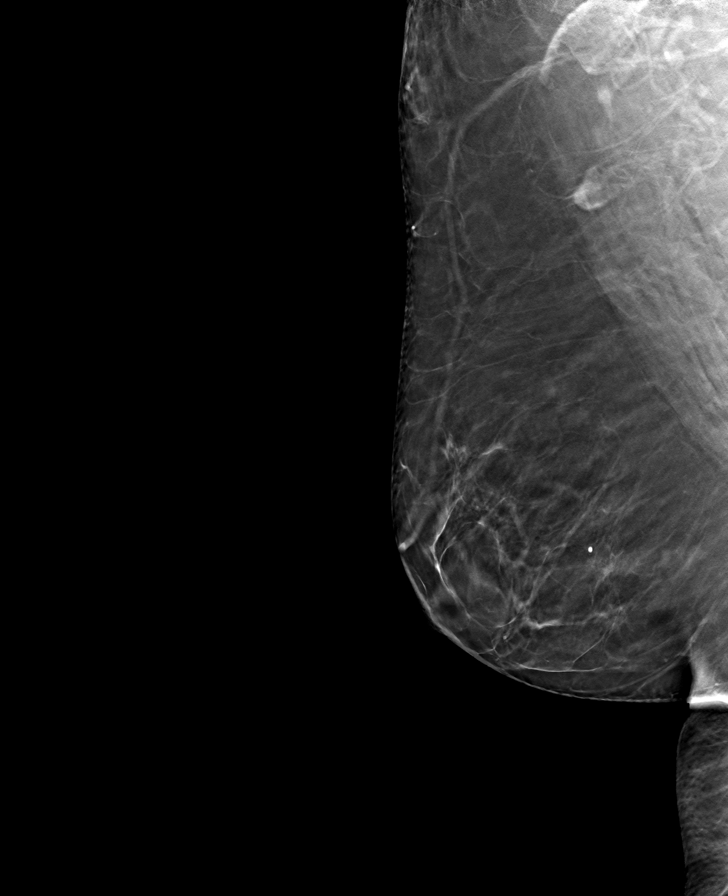

[R CC tomo · tomo slice 35/70.0]
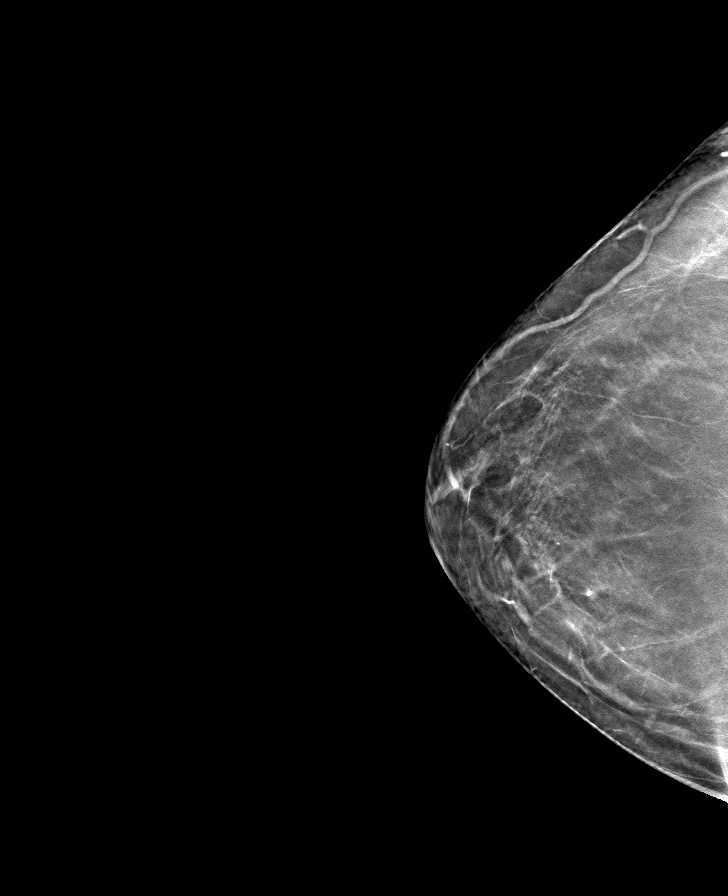

[8 of 24 positions shown; findings below may reference images not displayed]

ACR Breast Density Category b: There are scattered areas of
fibroglandular density.
FINDINGS: There are no findings suspicious for malignancy. Images were
processed with CAD.
IMPRESSION: No mammographic evidence of malignancy. A result letter of this
screening mammogram will be mailed directly to the patient.

RECOMMENDATION:
Screening mammogram in one year. (Code:[id number])

BI-RADS CATEGORY  1: Negative.

## 2020-01-31 DIAGNOSIS — H52203 Unspecified astigmatism, bilateral: Secondary | ICD-10-CM | POA: Diagnosis not present

## 2020-01-31 DIAGNOSIS — Z961 Presence of intraocular lens: Secondary | ICD-10-CM | POA: Diagnosis not present

## 2020-01-31 DIAGNOSIS — H43813 Vitreous degeneration, bilateral: Secondary | ICD-10-CM | POA: Diagnosis not present

## 2020-02-13 ENCOUNTER — Other Ambulatory Visit: Payer: Self-pay | Admitting: Internal Medicine

## 2020-02-13 DIAGNOSIS — Z1231 Encounter for screening mammogram for malignant neoplasm of breast: Secondary | ICD-10-CM

## 2020-03-14 DIAGNOSIS — Z23 Encounter for immunization: Secondary | ICD-10-CM | POA: Diagnosis not present

## 2020-03-28 DIAGNOSIS — Z23 Encounter for immunization: Secondary | ICD-10-CM | POA: Diagnosis not present

## 2020-04-20 ENCOUNTER — Ambulatory Visit: Payer: Medicare PPO

## 2020-04-29 DIAGNOSIS — Z85828 Personal history of other malignant neoplasm of skin: Secondary | ICD-10-CM | POA: Diagnosis not present

## 2020-04-29 DIAGNOSIS — L718 Other rosacea: Secondary | ICD-10-CM | POA: Diagnosis not present

## 2020-04-29 DIAGNOSIS — L821 Other seborrheic keratosis: Secondary | ICD-10-CM | POA: Diagnosis not present

## 2020-04-29 DIAGNOSIS — L239 Allergic contact dermatitis, unspecified cause: Secondary | ICD-10-CM | POA: Diagnosis not present

## 2020-04-29 DIAGNOSIS — L814 Other melanin hyperpigmentation: Secondary | ICD-10-CM | POA: Diagnosis not present

## 2020-05-08 DIAGNOSIS — Z1231 Encounter for screening mammogram for malignant neoplasm of breast: Secondary | ICD-10-CM

## 2020-05-08 DIAGNOSIS — H903 Sensorineural hearing loss, bilateral: Secondary | ICD-10-CM | POA: Diagnosis not present

## 2020-06-17 ENCOUNTER — Ambulatory Visit
Admission: RE | Admit: 2020-06-17 | Discharge: 2020-06-17 | Disposition: A | Discharge status: Home / Self Care | Payer: Medicare PPO | Source: Ambulatory Visit | Attending: Internal Medicine | Admitting: Internal Medicine

## 2020-06-17 ENCOUNTER — Other Ambulatory Visit: Payer: Self-pay

## 2020-06-17 DIAGNOSIS — Z1231 Encounter for screening mammogram for malignant neoplasm of breast: Secondary | ICD-10-CM

## 2020-09-01 ENCOUNTER — Other Ambulatory Visit: Payer: Self-pay

## 2020-09-01 DIAGNOSIS — N83201 Unspecified ovarian cyst, right side: Secondary | ICD-10-CM

## 2020-09-07 DIAGNOSIS — E782 Mixed hyperlipidemia: Secondary | ICD-10-CM | POA: Diagnosis not present

## 2020-09-07 DIAGNOSIS — I10 Essential (primary) hypertension: Secondary | ICD-10-CM | POA: Diagnosis not present

## 2020-09-07 DIAGNOSIS — R7301 Impaired fasting glucose: Secondary | ICD-10-CM | POA: Diagnosis not present

## 2020-09-07 DIAGNOSIS — E039 Hypothyroidism, unspecified: Secondary | ICD-10-CM | POA: Diagnosis not present

## 2020-09-14 DIAGNOSIS — I10 Essential (primary) hypertension: Secondary | ICD-10-CM | POA: Diagnosis not present

## 2020-09-14 DIAGNOSIS — I517 Cardiomegaly: Secondary | ICD-10-CM | POA: Diagnosis not present

## 2020-09-14 DIAGNOSIS — H9190 Unspecified hearing loss, unspecified ear: Secondary | ICD-10-CM | POA: Diagnosis not present

## 2020-09-14 DIAGNOSIS — Z1331 Encounter for screening for depression: Secondary | ICD-10-CM | POA: Diagnosis not present

## 2020-09-14 DIAGNOSIS — I8390 Asymptomatic varicose veins of unspecified lower extremity: Secondary | ICD-10-CM | POA: Diagnosis not present

## 2020-09-14 DIAGNOSIS — Z1212 Encounter for screening for malignant neoplasm of rectum: Secondary | ICD-10-CM | POA: Diagnosis not present

## 2020-09-14 DIAGNOSIS — R7301 Impaired fasting glucose: Secondary | ICD-10-CM | POA: Diagnosis not present

## 2020-09-14 DIAGNOSIS — Z0000 Encounter for general adult medical examination without abnormal findings: Secondary | ICD-10-CM | POA: Diagnosis not present

## 2020-09-14 DIAGNOSIS — I700 Atherosclerosis of aorta: Secondary | ICD-10-CM | POA: Diagnosis not present

## 2020-09-14 DIAGNOSIS — K5790 Diverticulosis of intestine, part unspecified, without perforation or abscess without bleeding: Secondary | ICD-10-CM | POA: Diagnosis not present

## 2020-09-14 DIAGNOSIS — R82998 Other abnormal findings in urine: Secondary | ICD-10-CM | POA: Diagnosis not present

## 2020-09-14 DIAGNOSIS — E782 Mixed hyperlipidemia: Secondary | ICD-10-CM | POA: Diagnosis not present

## 2020-09-14 DIAGNOSIS — Z1339 Encounter for screening examination for other mental health and behavioral disorders: Secondary | ICD-10-CM | POA: Diagnosis not present

## 2020-09-14 LAB — IFOBT (OCCULT BLOOD): IFOBT: NEGATIVE

## 2020-09-15 ENCOUNTER — Other Ambulatory Visit: Payer: Self-pay | Admitting: Internal Medicine

## 2020-09-15 DIAGNOSIS — E785 Hyperlipidemia, unspecified: Secondary | ICD-10-CM

## 2020-09-29 ENCOUNTER — Ambulatory Visit
Admission: RE | Admit: 2020-09-29 | Discharge: 2020-09-29 | Disposition: A | Discharge status: Home / Self Care | Payer: No Typology Code available for payment source | Source: Ambulatory Visit | Attending: Internal Medicine | Admitting: Internal Medicine

## 2020-09-29 DIAGNOSIS — E785 Hyperlipidemia, unspecified: Secondary | ICD-10-CM

## 2021-01-22 DIAGNOSIS — E782 Mixed hyperlipidemia: Secondary | ICD-10-CM | POA: Diagnosis not present

## 2021-01-28 ENCOUNTER — Ambulatory Visit: Payer: Medicare PPO | Admitting: Obstetrics & Gynecology

## 2021-01-28 ENCOUNTER — Ambulatory Visit (INDEPENDENT_AMBULATORY_CARE_PROVIDER_SITE_OTHER): Payer: Medicare PPO

## 2021-01-28 ENCOUNTER — Encounter: Payer: Self-pay | Admitting: Obstetrics & Gynecology

## 2021-01-28 ENCOUNTER — Other Ambulatory Visit: Payer: Self-pay

## 2021-01-28 VITALS — BP 120/78 | HR 79 | Resp 16 | Ht 65.0 in | Wt 178.0 lb

## 2021-01-28 DIAGNOSIS — Z780 Asymptomatic menopausal state: Secondary | ICD-10-CM | POA: Diagnosis not present

## 2021-01-28 DIAGNOSIS — N83201 Unspecified ovarian cyst, right side: Secondary | ICD-10-CM | POA: Diagnosis not present

## 2021-01-28 DIAGNOSIS — Z01419 Encounter for gynecological examination (general) (routine) without abnormal findings: Secondary | ICD-10-CM | POA: Diagnosis not present

## 2021-01-28 NOTE — Progress Notes (Signed)
Bridget Hartman February 18, 1946 [id number]   History:    75 y.o. G1P1L1 Married.  Has Postponed her trip to [place] 3 times, finally going to [place] in October 2022.  RP:  Established patient presenting for annual gyn exam and f/u on Rt Ovarian Cyst by Pelvic US   HPI: Postmenopause, well on no HRT.  No PMB.  Occasional "twinges" in the Rt pelvic area.  Pelvic US today shows a simple Rt ovarian cyst decreased in size from 1.6 cm in 2020 to 1.49 cm today.  No pain with intercourse.  Urine and bowel movements normal.  Breasts normal.  Body mass index .  Walking.  Health labs with family physician.  Colono scheduled next week.  BD normal in 2019.  Past medical history,surgical history, family history and social history were all reviewed and documented in the EPIC chart.  Gynecologic History No LMP recorded (lmp unknown). Patient is postmenopausal.  Obstetric History OB History  Gravida Para Term Preterm AB Living  1 1       1   SAB IAB Ectopic Multiple Live Births               # Outcome Date GA Lbr Len/2nd Weight Sex Delivery Anes PTL Lv  1 Para              ROS: A ROS was performed and pertinent positives and negatives are included in the history.  GENERAL: No fevers or chills. HEENT: No change in vision, no earache, sore throat or sinus congestion. NECK: No pain or stiffness. CARDIOVASCULAR: No chest pain or pressure. No palpitations. PULMONARY: No shortness of breath, cough or wheeze. GASTROINTESTINAL: No abdominal pain, nausea, vomiting or diarrhea, melena or bright red blood per rectum. GENITOURINARY: No urinary frequency, urgency, hesitancy or dysuria. MUSCULOSKELETAL: No joint or muscle pain, no back pain, no recent trauma. DERMATOLOGIC: No rash, no itching, no lesions. ENDOCRINE: No polyuria, polydipsia, no heat or cold intolerance. No recent change in weight. HEMATOLOGICAL: No anemia or easy bruising or bleeding. NEUROLOGIC: No headache, seizures, numbness, tingling or weakness.  PSYCHIATRIC: No depression, no loss of interest in normal activity or change in sleep pattern.     Exam:   01/28/2021 2:25 PM  BP 120/78  Pulse 79  Resp 16  Weight 178 lb (80.7 kg)  Height 5\' 5"  (1.651 m)   Other Vitals  BMI 29.62 kg/m2  BSA 1.92 m2  LMP LMP Unknown  Menstrual status Postmenopausal     General appearance : Well developed well nourished female. No acute distress HEENT: Eyes: no retinal hemorrhage or exudates,  Neck supple, trachea midline, no carotid bruits, no thyroidmegaly Lungs: Clear to auscultation, no rhonchi or wheezes, or rib retractions  Heart: Regular rate and rhythm, no murmurs or gallops Breast:Examined in sitting and supine position were symmetrical in appearance, no palpable masses or tenderness,  no skin retraction, no nipple inversion, no nipple discharge, no skin discoloration, no axillary or supraclavicular lymphadenopathy Abdomen: no palpable masses or tenderness, no rebound or guarding Extremities: no edema or skin discoloration or tenderness  Pelvic: Vulva: Normal             Vagina: No gross lesions or discharge  Cervix: No gross lesions or discharge  Uterus  AV, normal size, shape and consistency, non-tender and mobile  Adnexa  Without masses or tenderness  Anus: Normal  Pelvic 03/30/2021 today: T/V images.  Anteverted uterus normal in size and shape with no myometrial mass.  The uterus  is measured at 2.34 cm in height.  The endometrial lining is thin and symmetrical with no mass or thickening seen, measured at 3.71 mm.  The right ovary presents a 1.49 x 1 cm simple cyst, slightly smaller than on the previous scan in 2020.  The left ovary is atrophic in appearance.  No free fluid in the pelvis.   Assessment/Plan:  75 y.o. female for annual exam   1. Well female exam with routine gynecological exam Normal gynecologic exam.  No indication for a Pap test this year, will repeat a Pap at next annual/gyn visit.  Breasts normal. Screening mammo Neg 05/2020.   Colono scheduled next week.  Health labs with Fam MD.  BMI 29.62.  2. Postmenopause Well on no HRT.  No PMB.  3. Right ovarian cyst Simple smaller Rt ovarian cyst at 1.49 cm on Pelvic US today.  Benign appearance, patient reassured.  Pelvic US otherwise Neg.  Will repeat Pelvic US q2 yrs.  Other orders - atorvastatin (LIPITOR) 10 MG tablet; Take 10 mg by mouth daily. - metoprolol succinate (TOPROL-XL) 50 MG 24 hr tablet; Take 50 mg by mouth daily. - UNABLE TO FIND; 1,500 mg. Med Name: tumeric po   Genia Del MD, 2:34 PM 01/28/2021

## 2021-02-09 DIAGNOSIS — Z86010 Personal history of colonic polyps: Secondary | ICD-10-CM | POA: Diagnosis not present

## 2021-02-09 DIAGNOSIS — D125 Benign neoplasm of sigmoid colon: Secondary | ICD-10-CM | POA: Diagnosis not present

## 2021-02-09 DIAGNOSIS — K5730 Diverticulosis of large intestine without perforation or abscess without bleeding: Secondary | ICD-10-CM | POA: Diagnosis not present

## 2021-02-10 NOTE — Progress Notes (Signed)
CARDIOLOGY CONSULT NOTE       Patient ID: Dasja Brase MRN: [id number] DOB/AGE: 76-Aug-1947 76 y.o.  Admit date: (Not on file) Referring Physician: Gasper Lloyd Primary Physician: Cleatis Polka., MD Primary Cardiologist: New Reason for Consultation: CAD  Active Problems:   * No active hospital problems. *   HPI:  75 y.o. with vascular concerns and high calcium score referred by Gasper Lloyd for evaluation. No clinical CAD , no angina chest pain History of HTN and hypothyroidism She spends 3 months/year at [facility] as she does not tolerate the heat She has a trip to [place] planned next month. Calcium score 165 which was 68 th percentile for age and sex. He lipitor dose was just increased as her LDL was in the 90 range She has no other signs of vascualar disease   ROS All other systems reviewed and negative except as noted above  Past Medical History:  Diagnosis Date   Chest pain    Dyspnea    Hypertension    Impaired fasting glucose    Shingles    Subclinical hypothyroidism    Thyroid disease     Family History  Problem Relation Age of Onset   Colon cancer Mother    Breast cancer Mother    Hypertension Mother    Thyroid disease Mother    Atrial fibrillation Father    Cervical cancer Sister    Diabetes type II Sister    Heart disease Paternal Grandmother    Heart attack Paternal Grandfather    Gallbladder disease Paternal Grandfather    Diabetes Paternal Grandfather     Social History   Socioeconomic History   Marital status: Married    Spouse name: Not on file   Number of children: Not on file   Years of education: Not on file   Highest education level: Not on file  Occupational History   Not on file  Tobacco Use   Smoking status: Never   Smokeless tobacco: Never  Vaping Use   Vaping Use: Never used  Substance and Sexual Activity   Alcohol use: Not Currently    Comment: 2 GLASSES OF WINE A DAY   Drug use: Never   Sexual activity: Not  Currently    Partners: Male    Birth control/protection: Post-menopausal    Comment: 1st intercourse-  partners- less than 5,   Other Topics Concern   Not on file  Social History Narrative   Not on file   Social Determinants of Health   Financial Resource Strain: Not on file  Food Insecurity: Not on file  Transportation Needs: Not on file  Physical Activity: Not on file  Stress: Not on file  Social Connections: Not on file  Intimate Partner Violence: Not on file    Past Surgical History:  Procedure Laterality Date   KNEE ARTHROSCOPY Left    LUNG REMOVAL, PARTIAL     VEIN LIGATION AND STRIPPING     WRIST SURGERY Left       Current Outpatient Medications:    atorvastatin (LIPITOR) 10 MG tablet, Take 20 mg by mouth daily., Disp: , Rfl:    Calcium Carb-Cholecalciferol (CALCIUM 600 + D PO), Take 1 tablet by mouth daily., Disp: , Rfl:    hydrochlorothiazide (HYDRODIURIL) 25 MG tablet, Take 12.5 mg by mouth daily., Disp: , Rfl:    metoprolol succinate (TOPROL-XL) 50 MG 24 hr tablet, Take 50 mg by mouth daily., Disp: , Rfl:    Multiple Vitamins-Minerals (  MULTIVITAMIN WITH MINERALS) tablet, Take 1 tablet by mouth daily., Disp: , Rfl:    Omega-3 Fatty Acids (FISH OIL TRIPLE STRENGTH) 1400 MG CAPS, Take 1 capsule by mouth daily., Disp: , Rfl:    UNABLE TO FIND, 1,500 mg. Med Name: tumeric po, Disp: , Rfl:     Physical Exam: Blood pressure 120/76, pulse 73, height 5\' 5"  (1.651 m), weight 81.7 kg, SpO2 91 %.    Affect appropriate Healthy:  appears stated age HEENT: normal Neck supple with no adenopathy JVP normal no bruits no thyromegaly Lungs clear with no wheezing and good diaphragmatic motion Heart:  S1/S2 no murmur, no rub, gallop or click PMI normal Abdomen: benighn, BS positve, no tenderness, no AAA no bruit.  No HSM or HJR Distal pulses intact with no bruits No edema Neuro non-focal Skin warm and dry No muscular weakness   Labs:  No results found for: WBC, HGB,  HCT, MCV, PLT No results for input(s): NA, K, CL, CO2, BUN, CREATININE, CALCIUM, PROT, BILITOT, ALKPHOS, ALT, AST, GLUCOSE in the last 168 hours.  Invalid input(s): LABALBU No results found for: CKTOTAL, CKMB, CKMBINDEX, TROPONINI No results found for: CHOL No results found for: HDL No results found for: LDLCALC No results found for: TRIG No results found for: CHOLHDL No results found for: LDLDIRECT    Radiology: Transvaginal Non-OB  Result Date: 01/31/2021 T/V images.  Anteverted uterus normal in size and shape with no myometrial mass.  The uterus is measured at 2.34 cm in height.  The endometrial lining is thin and symmetrical with no mass or thickening seen, measured at 3.71 mm.  The right ovary presents a 1.49 x 1 cm simple cyst, slightly smaller than on the previous scan in 2020.  The left ovary is atrophic in appearance.  No free fluid in the pelvis.     EKG: NSR normal    ASSESSMENT AND PLAN:  CAD: sub clinical on calcium score higher than average percentile Active with no chest pain no indication for stress testing Target LDL < 70 f/u labs with primar yafter lipitor dose increased Thyroid:  continue follow labs not on replacement currently HTN; Well controlled.  Continue current medications and low sodium Dash type diet.   Travel: has had appropriate shots and script for malarial meds for trip to 2021  F/U in a year   Signed: [place] 02/12/2021, 4:28 PM

## 2021-02-12 ENCOUNTER — Ambulatory Visit: Payer: Medicare PPO | Admitting: Cardiovascular Disease

## 2021-02-12 ENCOUNTER — Encounter: Payer: Self-pay | Admitting: Cardiovascular Disease

## 2021-02-12 ENCOUNTER — Other Ambulatory Visit: Payer: Self-pay

## 2021-02-12 VITALS — BP 120/76 | HR 73 | Ht 65.0 in | Wt 180.2 lb

## 2021-02-12 DIAGNOSIS — D125 Benign neoplasm of sigmoid colon: Secondary | ICD-10-CM | POA: Diagnosis not present

## 2021-02-12 DIAGNOSIS — R0789 Other chest pain: Secondary | ICD-10-CM

## 2021-02-12 DIAGNOSIS — R0602 Shortness of breath: Secondary | ICD-10-CM

## 2021-02-12 NOTE — Patient Instructions (Signed)
Medication Instructions:  *If you need a refill on your cardiac medications before your next appointment, please call your pharmacy*  Lab Work: If you have labs (blood work) drawn today and your tests are completely normal, you will receive your results only by: MyChart Message (if you have MyChart) OR A paper copy in the mail If you have any lab test that is abnormal or we need to change your treatment, we will call you to review the results.  Follow-Up: At [facility], you and your health needs are our priority.  As part of our continuing mission to provide you with exceptional heart care, we have created designated Provider Care Teams.  These Care Teams include your primary Cardiologist (physician) and Advanced Practice Providers (APPs -  Physician Assistants and Nurse Practitioners) who all work together to provide you with the care you need, when you need it.  We recommend signing up for the patient portal called "MyChart".  Sign up information is provided on this After Visit Summary.  MyChart is used to connect with patients for Virtual Visits (Telemedicine).  Patients are able to view lab/test results, encounter notes, upcoming appointments, etc.  Non-urgent messages can be sent to your provider as well.   To learn more about what you can do with MyChart, go to [email or web address].    Your next appointment:   12 year(s)  The format for your next appointment:   In Person  Provider:   You may see Dr. [name] or one of the following Advanced Practice Providers on your designated Care Team:   Nada Boozer, NP

## 2021-03-03 ENCOUNTER — Ambulatory Visit: Payer: Medicare PPO | Admitting: Cardiovascular Disease

## 2021-04-08 DIAGNOSIS — Z23 Encounter for immunization: Secondary | ICD-10-CM | POA: Diagnosis not present

## 2021-05-11 DIAGNOSIS — M1711 Unilateral primary osteoarthritis, right knee: Secondary | ICD-10-CM | POA: Diagnosis not present

## 2021-05-11 DIAGNOSIS — M25561 Pain in right knee: Secondary | ICD-10-CM | POA: Diagnosis not present

## 2021-05-18 ENCOUNTER — Other Ambulatory Visit: Payer: Self-pay | Admitting: Orthopedic Surgery

## 2021-05-18 DIAGNOSIS — M25561 Pain in right knee: Secondary | ICD-10-CM

## 2021-05-19 ENCOUNTER — Other Ambulatory Visit: Payer: Self-pay | Admitting: Internal Medicine

## 2021-05-19 DIAGNOSIS — Z1231 Encounter for screening mammogram for malignant neoplasm of breast: Secondary | ICD-10-CM

## 2021-05-25 ENCOUNTER — Ambulatory Visit
Admission: RE | Admit: 2021-05-25 | Discharge: 2021-05-25 | Disposition: A | Discharge status: Home / Self Care | Payer: Medicare PPO | Source: Ambulatory Visit | Attending: Orthopedic Surgery | Admitting: Orthopedic Surgery

## 2021-05-25 DIAGNOSIS — M25561 Pain in right knee: Secondary | ICD-10-CM

## 2021-06-21 ENCOUNTER — Ambulatory Visit
Admission: RE | Admit: 2021-06-21 | Discharge: 2021-06-21 | Disposition: A | Discharge status: Home / Self Care | Payer: Medicare PPO | Source: Ambulatory Visit | Attending: Internal Medicine | Admitting: Internal Medicine

## 2021-06-21 DIAGNOSIS — Z1231 Encounter for screening mammogram for malignant neoplasm of breast: Secondary | ICD-10-CM

## 2021-06-22 DIAGNOSIS — S83241A Other tear of medial meniscus, current injury, right knee, initial encounter: Secondary | ICD-10-CM | POA: Diagnosis not present

## 2021-07-21 DIAGNOSIS — H04123 Dry eye syndrome of bilateral lacrimal glands: Secondary | ICD-10-CM | POA: Diagnosis not present

## 2021-07-21 DIAGNOSIS — H43813 Vitreous degeneration, bilateral: Secondary | ICD-10-CM | POA: Diagnosis not present

## 2021-07-21 DIAGNOSIS — H52203 Unspecified astigmatism, bilateral: Secondary | ICD-10-CM | POA: Diagnosis not present

## 2021-07-21 DIAGNOSIS — H524 Presbyopia: Secondary | ICD-10-CM | POA: Diagnosis not present

## 2021-09-10 DIAGNOSIS — E782 Mixed hyperlipidemia: Secondary | ICD-10-CM | POA: Diagnosis not present

## 2021-09-10 DIAGNOSIS — E039 Hypothyroidism, unspecified: Secondary | ICD-10-CM | POA: Diagnosis not present

## 2021-09-10 DIAGNOSIS — I10 Essential (primary) hypertension: Secondary | ICD-10-CM | POA: Diagnosis not present

## 2021-09-10 DIAGNOSIS — R7301 Impaired fasting glucose: Secondary | ICD-10-CM | POA: Diagnosis not present

## 2021-09-15 DIAGNOSIS — R7301 Impaired fasting glucose: Secondary | ICD-10-CM | POA: Diagnosis not present

## 2021-09-15 DIAGNOSIS — I517 Cardiomegaly: Secondary | ICD-10-CM | POA: Diagnosis not present

## 2021-09-15 DIAGNOSIS — Z1339 Encounter for screening examination for other mental health and behavioral disorders: Secondary | ICD-10-CM | POA: Diagnosis not present

## 2021-09-15 DIAGNOSIS — N182 Chronic kidney disease, stage 2 (mild): Secondary | ICD-10-CM | POA: Diagnosis not present

## 2021-09-15 DIAGNOSIS — Z0000 Encounter for general adult medical examination without abnormal findings: Secondary | ICD-10-CM | POA: Diagnosis not present

## 2021-09-15 DIAGNOSIS — I2510 Atherosclerotic heart disease of native coronary artery without angina pectoris: Secondary | ICD-10-CM | POA: Diagnosis not present

## 2021-09-15 DIAGNOSIS — I10 Essential (primary) hypertension: Secondary | ICD-10-CM | POA: Diagnosis not present

## 2021-09-15 DIAGNOSIS — E782 Mixed hyperlipidemia: Secondary | ICD-10-CM | POA: Diagnosis not present

## 2021-09-15 DIAGNOSIS — I129 Hypertensive chronic kidney disease with stage 1 through stage 4 chronic kidney disease, or unspecified chronic kidney disease: Secondary | ICD-10-CM | POA: Diagnosis not present

## 2021-09-15 DIAGNOSIS — R82998 Other abnormal findings in urine: Secondary | ICD-10-CM | POA: Diagnosis not present

## 2021-09-15 DIAGNOSIS — Z1331 Encounter for screening for depression: Secondary | ICD-10-CM | POA: Diagnosis not present

## 2021-09-15 DIAGNOSIS — I700 Atherosclerosis of aorta: Secondary | ICD-10-CM | POA: Diagnosis not present

## 2021-09-21 DIAGNOSIS — L821 Other seborrheic keratosis: Secondary | ICD-10-CM | POA: Diagnosis not present

## 2021-09-21 DIAGNOSIS — L720 Epidermal cyst: Secondary | ICD-10-CM | POA: Diagnosis not present

## 2021-09-21 DIAGNOSIS — L738 Other specified follicular disorders: Secondary | ICD-10-CM | POA: Diagnosis not present

## 2021-09-21 DIAGNOSIS — L918 Other hypertrophic disorders of the skin: Secondary | ICD-10-CM | POA: Diagnosis not present

## 2021-09-21 DIAGNOSIS — D225 Melanocytic nevi of trunk: Secondary | ICD-10-CM | POA: Diagnosis not present

## 2021-09-21 DIAGNOSIS — L570 Actinic keratosis: Secondary | ICD-10-CM | POA: Diagnosis not present

## 2021-09-21 DIAGNOSIS — Z85828 Personal history of other malignant neoplasm of skin: Secondary | ICD-10-CM | POA: Diagnosis not present

## 2021-09-21 DIAGNOSIS — D3612 Benign neoplasm of peripheral nerves and autonomic nervous system, upper limb, including shoulder: Secondary | ICD-10-CM | POA: Diagnosis not present

## 2021-09-21 DIAGNOSIS — D224 Melanocytic nevi of scalp and neck: Secondary | ICD-10-CM | POA: Diagnosis not present

## 2022-01-25 DIAGNOSIS — M25511 Pain in right shoulder: Secondary | ICD-10-CM | POA: Diagnosis not present

## 2022-01-25 DIAGNOSIS — M75111 Incomplete rotator cuff tear or rupture of right shoulder, not specified as traumatic: Secondary | ICD-10-CM | POA: Diagnosis not present

## 2022-01-25 DIAGNOSIS — G8929 Other chronic pain: Secondary | ICD-10-CM | POA: Diagnosis not present

## 2022-02-11 DIAGNOSIS — M25511 Pain in right shoulder: Secondary | ICD-10-CM | POA: Diagnosis not present

## 2022-02-11 DIAGNOSIS — G8929 Other chronic pain: Secondary | ICD-10-CM | POA: Diagnosis not present

## 2022-02-11 DIAGNOSIS — M75111 Incomplete rotator cuff tear or rupture of right shoulder, not specified as traumatic: Secondary | ICD-10-CM | POA: Diagnosis not present

## 2022-03-04 DIAGNOSIS — G8929 Other chronic pain: Secondary | ICD-10-CM | POA: Diagnosis not present

## 2022-03-04 DIAGNOSIS — M75111 Incomplete rotator cuff tear or rupture of right shoulder, not specified as traumatic: Secondary | ICD-10-CM | POA: Diagnosis not present

## 2022-03-04 DIAGNOSIS — M25511 Pain in right shoulder: Secondary | ICD-10-CM | POA: Diagnosis not present

## 2022-03-11 NOTE — Progress Notes (Signed)
CARDIOLOGY CONSULT NOTE       Patient ID: Bridget Hartman MRN: [id number] DOB/AGE: Mar 27, 1946 76 y.o.  Admit date: (Not on file) Referring Physician: Gasper Lloyd Primary Physician: Cleatis Polka., MD Primary Cardiologist: [name] Reason for Consultation: CAD   HPI:  76 y.o. with vascular concerns and high calcium score referred by [facility] September 2022.  No clinical CAD , no angina chest pain History of HTN and hypothyroidism She spends 3 months/year at [facility] as she does not tolerate the heat  Calcium score 165 which was 68 th percentile for age and sex. He lipitor dose was just increased as her LDL was in the 90 range She has no other signs of vascualar disease   Had a nice trip to [place] last year and [place] this year Seeing Dr Clelia Croft tomorrow and I encouraged her to increase lipitor to 40 mg If LDL > 70  She strained her back recently and is seeing a chiropractor Has bilateral knee Issues post left meniscal surgery   No chest pain Swims a mile/day with no issues    ROS All other systems reviewed and negative except as noted above  Past Medical History:  Diagnosis Date   Chest pain    Dyspnea    Hypertension    Impaired fasting glucose    Shingles    Subclinical hypothyroidism    Thyroid disease     Family History  Problem Relation Age of Onset   Colon cancer Mother    Breast cancer Mother    Hypertension Mother    Thyroid disease Mother    Atrial fibrillation Father    Cervical cancer Sister    Diabetes type II Sister    Heart disease Paternal Grandmother    Heart attack Paternal Grandfather    Gallbladder disease Paternal Grandfather    Diabetes Paternal Grandfather     Social History   Socioeconomic History   Marital status: Married    Spouse name: Not on file   Number of children: Not on file   Years of education: Not on file   Highest education level: Not on file  Occupational History   Not on file  Tobacco Use   Smoking  status: Never   Smokeless tobacco: Never  Vaping Use   Vaping Use: Never used  Substance and Sexual Activity   Alcohol use: Not Currently    Comment: 2 GLASSES OF WINE A DAY   Drug use: Never   Sexual activity: Not Currently    Partners: Male    Birth control/protection: Post-menopausal    Comment: 1st intercourse-  partners- less than 5,   Other Topics Concern   Not on file  Social History Narrative   Not on file   Social Determinants of Health   Financial Resource Strain: Not on file  Food Insecurity: Not on file  Transportation Needs: Not on file  Physical Activity: Not on file  Stress: Not on file  Social Connections: Not on file  Intimate Partner Violence: Not on file    Past Surgical History:  Procedure Laterality Date   KNEE ARTHROSCOPY Left    LUNG REMOVAL, PARTIAL     VEIN LIGATION AND STRIPPING     WRIST SURGERY Left       Current Outpatient Medications:    atorvastatin (LIPITOR) 10 MG tablet, Take 20 mg by mouth daily., Disp: , Rfl:    Calcium Carb-Cholecalciferol (CALCIUM 600 + D PO), Take 1 tablet by mouth daily.,  Disp: , Rfl:    hydrochlorothiazide (HYDRODIURIL) 25 MG tablet, Take 12.5 mg by mouth daily., Disp: , Rfl:    metoprolol succinate (TOPROL-XL) 50 MG 24 hr tablet, Take 50 mg by mouth daily., Disp: , Rfl:    Multiple Vitamins-Minerals (MULTIVITAMIN WITH MINERALS) tablet, Take 1 tablet by mouth daily., Disp: , Rfl:    Omega-3 Fatty Acids (FISH OIL TRIPLE STRENGTH) 1400 MG CAPS, Take 1 capsule by mouth daily., Disp: , Rfl:    UNABLE TO FIND, 1,500 mg. Med Name: tumeric po, Disp: , Rfl:     Physical Exam: Blood pressure 134/80, pulse 80, height 5\' 6"  (1.676 m), weight 175 lb 12.8 oz (79.7 kg), SpO2 96 %.    Affect appropriate Healthy:  appears stated age 71: normal Neck supple with no adenopathy JVP normal no bruits no thyromegaly Lungs clear with no wheezing and good diaphragmatic motion Heart:  S1/S2 no murmur, no rub, gallop or  click PMI normal Abdomen: benighn, BS positve, no tenderness, no AAA no bruit.  No HSM or HJR Distal pulses intact with no bruits No edema Neuro non-focal Skin warm and dry No muscular weakness   Labs:  No results found for: "WBC", "HGB", "HCT", "MCV", "PLT" No results for input(s): "NA", "K", "CL", "CO2", "BUN", "CREATININE", "CALCIUM", "PROT", "BILITOT", "ALKPHOS", "ALT", "AST", "GLUCOSE" in the last 168 hours.  Invalid input(s): "LABALBU" No results found for: "CKTOTAL", "CKMB", "CKMBINDEX", "TROPONINI" No results found for: "CHOL" No results found for: "HDL" No results found for: "LDLCALC" No results found for: "TRIG" No results found for: "CHOLHDL" No results found for: "LDLDIRECT"    Radiology: No results found.  EKG: 03/15/22 SR rate 80 ICRBBB LAD non specific ST changes    ASSESSMENT AND PLAN:  CAD: sub clinical on calcium score higher than average percentile Active with no chest pain no indication for stress testing Target LDL < 70 f/u labs with primar  LDL 103 08/2021 Lipitor dose per primary should likely be increased to 40 mg daily Thyroid:  continue follow labs not on replacement currently HTN; Well controlled.  Continue current medications and low sodium Dash type diet.     F/U in a year   Signed: Jenkins Rouge 03/15/2022, 2:11 PM

## 2022-03-14 DIAGNOSIS — M9906 Segmental and somatic dysfunction of lower extremity: Secondary | ICD-10-CM | POA: Diagnosis not present

## 2022-03-14 DIAGNOSIS — S83422A Sprain of lateral collateral ligament of left knee, initial encounter: Secondary | ICD-10-CM | POA: Diagnosis not present

## 2022-03-14 DIAGNOSIS — E782 Mixed hyperlipidemia: Secondary | ICD-10-CM | POA: Diagnosis not present

## 2022-03-14 DIAGNOSIS — M9901 Segmental and somatic dysfunction of cervical region: Secondary | ICD-10-CM | POA: Diagnosis not present

## 2022-03-14 DIAGNOSIS — S83412A Sprain of medial collateral ligament of left knee, initial encounter: Secondary | ICD-10-CM | POA: Diagnosis not present

## 2022-03-14 DIAGNOSIS — M62830 Muscle spasm of back: Secondary | ICD-10-CM | POA: Diagnosis not present

## 2022-03-14 DIAGNOSIS — R7989 Other specified abnormal findings of blood chemistry: Secondary | ICD-10-CM | POA: Diagnosis not present

## 2022-03-14 DIAGNOSIS — I10 Essential (primary) hypertension: Secondary | ICD-10-CM | POA: Diagnosis not present

## 2022-03-14 DIAGNOSIS — M9902 Segmental and somatic dysfunction of thoracic region: Secondary | ICD-10-CM | POA: Diagnosis not present

## 2022-03-14 DIAGNOSIS — M47816 Spondylosis without myelopathy or radiculopathy, lumbar region: Secondary | ICD-10-CM | POA: Diagnosis not present

## 2022-03-14 DIAGNOSIS — E039 Hypothyroidism, unspecified: Secondary | ICD-10-CM | POA: Diagnosis not present

## 2022-03-14 DIAGNOSIS — M47892 Other spondylosis, cervical region: Secondary | ICD-10-CM | POA: Diagnosis not present

## 2022-03-14 DIAGNOSIS — M9903 Segmental and somatic dysfunction of lumbar region: Secondary | ICD-10-CM | POA: Diagnosis not present

## 2022-03-15 ENCOUNTER — Ambulatory Visit: Payer: Medicare PPO | Attending: Cardiovascular Disease | Admitting: Cardiovascular Disease

## 2022-03-15 ENCOUNTER — Encounter: Payer: Self-pay | Admitting: Cardiovascular Disease

## 2022-03-15 VITALS — BP 134/80 | HR 80 | Ht 66.0 in | Wt 175.8 lb

## 2022-03-15 DIAGNOSIS — M47892 Other spondylosis, cervical region: Secondary | ICD-10-CM | POA: Diagnosis not present

## 2022-03-15 DIAGNOSIS — M9903 Segmental and somatic dysfunction of lumbar region: Secondary | ICD-10-CM | POA: Diagnosis not present

## 2022-03-15 DIAGNOSIS — E782 Mixed hyperlipidemia: Secondary | ICD-10-CM

## 2022-03-15 DIAGNOSIS — S83422A Sprain of lateral collateral ligament of left knee, initial encounter: Secondary | ICD-10-CM | POA: Diagnosis not present

## 2022-03-15 DIAGNOSIS — I2510 Atherosclerotic heart disease of native coronary artery without angina pectoris: Secondary | ICD-10-CM

## 2022-03-15 DIAGNOSIS — M47816 Spondylosis without myelopathy or radiculopathy, lumbar region: Secondary | ICD-10-CM | POA: Diagnosis not present

## 2022-03-15 DIAGNOSIS — S83412A Sprain of medial collateral ligament of left knee, initial encounter: Secondary | ICD-10-CM | POA: Diagnosis not present

## 2022-03-15 DIAGNOSIS — M9901 Segmental and somatic dysfunction of cervical region: Secondary | ICD-10-CM | POA: Diagnosis not present

## 2022-03-15 DIAGNOSIS — R0789 Other chest pain: Secondary | ICD-10-CM

## 2022-03-15 DIAGNOSIS — M9906 Segmental and somatic dysfunction of lower extremity: Secondary | ICD-10-CM | POA: Diagnosis not present

## 2022-03-15 DIAGNOSIS — M9902 Segmental and somatic dysfunction of thoracic region: Secondary | ICD-10-CM | POA: Diagnosis not present

## 2022-03-15 DIAGNOSIS — M62830 Muscle spasm of back: Secondary | ICD-10-CM | POA: Diagnosis not present

## 2022-03-15 NOTE — Patient Instructions (Addendum)
Medication Instructions:  Your physician recommends that you continue on your current medications as directed. Please refer to the Current Medication list given to you today.  *If you need a refill on your cardiac medications before your next appointment, please call your pharmacy*  Lab Work: If you have labs (blood work) drawn today and your tests are completely normal, you will receive your results only by: MyChart Message (if you have MyChart) OR A paper copy in the mail If you have any lab test that is abnormal or we need to change your treatment, we will call you to review the results.  Testing/Procedures: None ordered today.  Follow-Up: At [facility], you and your health needs are our priority.  As part of our continuing mission to provide you with exceptional heart care, we have created designated Provider Care Teams.  These Care Teams include your primary Cardiologist (physician) and Advanced Practice Providers (APPs -  Physician Assistants and Nurse Practitioners) who all work together to provide you with the care you need, when you need it.  We recommend signing up for the patient portal called "MyChart".  Sign up information is provided on this After Visit Summary.  MyChart is used to connect with patients for Virtual Visits (Telemedicine).  Patients are able to view lab/test results, encounter notes, upcoming appointments, etc.  Non-urgent messages can be sent to your provider as well.   To learn more about what you can do with MyChart, go to [email or web address].    Your next appointment:   12 month(s)  The format for your next appointment:   In Person  Provider:   Peter Nishan, MD      Important Information About Sugar       

## 2022-03-17 DIAGNOSIS — M47816 Spondylosis without myelopathy or radiculopathy, lumbar region: Secondary | ICD-10-CM | POA: Diagnosis not present

## 2022-03-17 DIAGNOSIS — M62830 Muscle spasm of back: Secondary | ICD-10-CM | POA: Diagnosis not present

## 2022-03-17 DIAGNOSIS — M9902 Segmental and somatic dysfunction of thoracic region: Secondary | ICD-10-CM | POA: Diagnosis not present

## 2022-03-17 DIAGNOSIS — S83422A Sprain of lateral collateral ligament of left knee, initial encounter: Secondary | ICD-10-CM | POA: Diagnosis not present

## 2022-03-17 DIAGNOSIS — M47892 Other spondylosis, cervical region: Secondary | ICD-10-CM | POA: Diagnosis not present

## 2022-03-17 DIAGNOSIS — M9906 Segmental and somatic dysfunction of lower extremity: Secondary | ICD-10-CM | POA: Diagnosis not present

## 2022-03-17 DIAGNOSIS — S83412A Sprain of medial collateral ligament of left knee, initial encounter: Secondary | ICD-10-CM | POA: Diagnosis not present

## 2022-03-17 DIAGNOSIS — M9903 Segmental and somatic dysfunction of lumbar region: Secondary | ICD-10-CM | POA: Diagnosis not present

## 2022-03-17 DIAGNOSIS — M9901 Segmental and somatic dysfunction of cervical region: Secondary | ICD-10-CM | POA: Diagnosis not present

## 2022-03-18 DIAGNOSIS — S83422A Sprain of lateral collateral ligament of left knee, initial encounter: Secondary | ICD-10-CM | POA: Diagnosis not present

## 2022-03-18 DIAGNOSIS — M9906 Segmental and somatic dysfunction of lower extremity: Secondary | ICD-10-CM | POA: Diagnosis not present

## 2022-03-18 DIAGNOSIS — M47816 Spondylosis without myelopathy or radiculopathy, lumbar region: Secondary | ICD-10-CM | POA: Diagnosis not present

## 2022-03-18 DIAGNOSIS — M9901 Segmental and somatic dysfunction of cervical region: Secondary | ICD-10-CM | POA: Diagnosis not present

## 2022-03-18 DIAGNOSIS — M47892 Other spondylosis, cervical region: Secondary | ICD-10-CM | POA: Diagnosis not present

## 2022-03-18 DIAGNOSIS — M9903 Segmental and somatic dysfunction of lumbar region: Secondary | ICD-10-CM | POA: Diagnosis not present

## 2022-03-18 DIAGNOSIS — M9902 Segmental and somatic dysfunction of thoracic region: Secondary | ICD-10-CM | POA: Diagnosis not present

## 2022-03-18 DIAGNOSIS — S83412A Sprain of medial collateral ligament of left knee, initial encounter: Secondary | ICD-10-CM | POA: Diagnosis not present

## 2022-03-18 DIAGNOSIS — M62830 Muscle spasm of back: Secondary | ICD-10-CM | POA: Diagnosis not present

## 2022-03-21 DIAGNOSIS — S83422A Sprain of lateral collateral ligament of left knee, initial encounter: Secondary | ICD-10-CM | POA: Diagnosis not present

## 2022-03-21 DIAGNOSIS — M62830 Muscle spasm of back: Secondary | ICD-10-CM | POA: Diagnosis not present

## 2022-03-21 DIAGNOSIS — M47892 Other spondylosis, cervical region: Secondary | ICD-10-CM | POA: Diagnosis not present

## 2022-03-21 DIAGNOSIS — M9903 Segmental and somatic dysfunction of lumbar region: Secondary | ICD-10-CM | POA: Diagnosis not present

## 2022-03-21 DIAGNOSIS — M9902 Segmental and somatic dysfunction of thoracic region: Secondary | ICD-10-CM | POA: Diagnosis not present

## 2022-03-21 DIAGNOSIS — M9901 Segmental and somatic dysfunction of cervical region: Secondary | ICD-10-CM | POA: Diagnosis not present

## 2022-03-21 DIAGNOSIS — M47816 Spondylosis without myelopathy or radiculopathy, lumbar region: Secondary | ICD-10-CM | POA: Diagnosis not present

## 2022-03-21 DIAGNOSIS — S83412A Sprain of medial collateral ligament of left knee, initial encounter: Secondary | ICD-10-CM | POA: Diagnosis not present

## 2022-03-21 DIAGNOSIS — M9906 Segmental and somatic dysfunction of lower extremity: Secondary | ICD-10-CM | POA: Diagnosis not present

## 2022-03-23 DIAGNOSIS — S83412A Sprain of medial collateral ligament of left knee, initial encounter: Secondary | ICD-10-CM | POA: Diagnosis not present

## 2022-03-23 DIAGNOSIS — M9902 Segmental and somatic dysfunction of thoracic region: Secondary | ICD-10-CM | POA: Diagnosis not present

## 2022-03-23 DIAGNOSIS — S83422A Sprain of lateral collateral ligament of left knee, initial encounter: Secondary | ICD-10-CM | POA: Diagnosis not present

## 2022-03-23 DIAGNOSIS — R7301 Impaired fasting glucose: Secondary | ICD-10-CM | POA: Diagnosis not present

## 2022-03-23 DIAGNOSIS — M47816 Spondylosis without myelopathy or radiculopathy, lumbar region: Secondary | ICD-10-CM | POA: Diagnosis not present

## 2022-03-23 DIAGNOSIS — M47892 Other spondylosis, cervical region: Secondary | ICD-10-CM | POA: Diagnosis not present

## 2022-03-23 DIAGNOSIS — M9903 Segmental and somatic dysfunction of lumbar region: Secondary | ICD-10-CM | POA: Diagnosis not present

## 2022-03-23 DIAGNOSIS — M62830 Muscle spasm of back: Secondary | ICD-10-CM | POA: Diagnosis not present

## 2022-03-23 DIAGNOSIS — I2510 Atherosclerotic heart disease of native coronary artery without angina pectoris: Secondary | ICD-10-CM | POA: Diagnosis not present

## 2022-03-23 DIAGNOSIS — M545 Low back pain, unspecified: Secondary | ICD-10-CM | POA: Diagnosis not present

## 2022-03-23 DIAGNOSIS — M9906 Segmental and somatic dysfunction of lower extremity: Secondary | ICD-10-CM | POA: Diagnosis not present

## 2022-03-23 DIAGNOSIS — M9901 Segmental and somatic dysfunction of cervical region: Secondary | ICD-10-CM | POA: Diagnosis not present

## 2022-03-23 DIAGNOSIS — I700 Atherosclerosis of aorta: Secondary | ICD-10-CM | POA: Diagnosis not present

## 2022-03-23 DIAGNOSIS — Z23 Encounter for immunization: Secondary | ICD-10-CM | POA: Diagnosis not present

## 2022-03-23 DIAGNOSIS — I129 Hypertensive chronic kidney disease with stage 1 through stage 4 chronic kidney disease, or unspecified chronic kidney disease: Secondary | ICD-10-CM | POA: Diagnosis not present

## 2022-03-23 DIAGNOSIS — E039 Hypothyroidism, unspecified: Secondary | ICD-10-CM | POA: Diagnosis not present

## 2022-03-23 DIAGNOSIS — N182 Chronic kidney disease, stage 2 (mild): Secondary | ICD-10-CM | POA: Diagnosis not present

## 2022-03-23 DIAGNOSIS — I517 Cardiomegaly: Secondary | ICD-10-CM | POA: Diagnosis not present

## 2022-03-23 DIAGNOSIS — E782 Mixed hyperlipidemia: Secondary | ICD-10-CM | POA: Diagnosis not present

## 2022-03-24 DIAGNOSIS — G8929 Other chronic pain: Secondary | ICD-10-CM | POA: Diagnosis not present

## 2022-03-24 DIAGNOSIS — M25511 Pain in right shoulder: Secondary | ICD-10-CM | POA: Diagnosis not present

## 2022-03-24 DIAGNOSIS — M75111 Incomplete rotator cuff tear or rupture of right shoulder, not specified as traumatic: Secondary | ICD-10-CM | POA: Diagnosis not present

## 2022-03-25 DIAGNOSIS — M47892 Other spondylosis, cervical region: Secondary | ICD-10-CM | POA: Diagnosis not present

## 2022-03-25 DIAGNOSIS — S83422A Sprain of lateral collateral ligament of left knee, initial encounter: Secondary | ICD-10-CM | POA: Diagnosis not present

## 2022-03-25 DIAGNOSIS — M9903 Segmental and somatic dysfunction of lumbar region: Secondary | ICD-10-CM | POA: Diagnosis not present

## 2022-03-25 DIAGNOSIS — M9902 Segmental and somatic dysfunction of thoracic region: Secondary | ICD-10-CM | POA: Diagnosis not present

## 2022-03-25 DIAGNOSIS — S83412A Sprain of medial collateral ligament of left knee, initial encounter: Secondary | ICD-10-CM | POA: Diagnosis not present

## 2022-03-25 DIAGNOSIS — M62830 Muscle spasm of back: Secondary | ICD-10-CM | POA: Diagnosis not present

## 2022-03-25 DIAGNOSIS — M9901 Segmental and somatic dysfunction of cervical region: Secondary | ICD-10-CM | POA: Diagnosis not present

## 2022-03-25 DIAGNOSIS — M47816 Spondylosis without myelopathy or radiculopathy, lumbar region: Secondary | ICD-10-CM | POA: Diagnosis not present

## 2022-03-25 DIAGNOSIS — M9906 Segmental and somatic dysfunction of lower extremity: Secondary | ICD-10-CM | POA: Diagnosis not present

## 2022-03-28 DIAGNOSIS — M47892 Other spondylosis, cervical region: Secondary | ICD-10-CM | POA: Diagnosis not present

## 2022-03-28 DIAGNOSIS — M9906 Segmental and somatic dysfunction of lower extremity: Secondary | ICD-10-CM | POA: Diagnosis not present

## 2022-03-28 DIAGNOSIS — M9902 Segmental and somatic dysfunction of thoracic region: Secondary | ICD-10-CM | POA: Diagnosis not present

## 2022-03-28 DIAGNOSIS — S83412A Sprain of medial collateral ligament of left knee, initial encounter: Secondary | ICD-10-CM | POA: Diagnosis not present

## 2022-03-28 DIAGNOSIS — M9901 Segmental and somatic dysfunction of cervical region: Secondary | ICD-10-CM | POA: Diagnosis not present

## 2022-03-28 DIAGNOSIS — S83422A Sprain of lateral collateral ligament of left knee, initial encounter: Secondary | ICD-10-CM | POA: Diagnosis not present

## 2022-03-28 DIAGNOSIS — M47816 Spondylosis without myelopathy or radiculopathy, lumbar region: Secondary | ICD-10-CM | POA: Diagnosis not present

## 2022-03-28 DIAGNOSIS — M62830 Muscle spasm of back: Secondary | ICD-10-CM | POA: Diagnosis not present

## 2022-03-28 DIAGNOSIS — M9903 Segmental and somatic dysfunction of lumbar region: Secondary | ICD-10-CM | POA: Diagnosis not present

## 2022-03-30 DIAGNOSIS — M62830 Muscle spasm of back: Secondary | ICD-10-CM | POA: Diagnosis not present

## 2022-03-30 DIAGNOSIS — M47892 Other spondylosis, cervical region: Secondary | ICD-10-CM | POA: Diagnosis not present

## 2022-03-30 DIAGNOSIS — S83412A Sprain of medial collateral ligament of left knee, initial encounter: Secondary | ICD-10-CM | POA: Diagnosis not present

## 2022-03-30 DIAGNOSIS — S83422A Sprain of lateral collateral ligament of left knee, initial encounter: Secondary | ICD-10-CM | POA: Diagnosis not present

## 2022-03-30 DIAGNOSIS — M9903 Segmental and somatic dysfunction of lumbar region: Secondary | ICD-10-CM | POA: Diagnosis not present

## 2022-03-30 DIAGNOSIS — M9902 Segmental and somatic dysfunction of thoracic region: Secondary | ICD-10-CM | POA: Diagnosis not present

## 2022-03-30 DIAGNOSIS — M9906 Segmental and somatic dysfunction of lower extremity: Secondary | ICD-10-CM | POA: Diagnosis not present

## 2022-03-30 DIAGNOSIS — M47816 Spondylosis without myelopathy or radiculopathy, lumbar region: Secondary | ICD-10-CM | POA: Diagnosis not present

## 2022-03-30 DIAGNOSIS — M9901 Segmental and somatic dysfunction of cervical region: Secondary | ICD-10-CM | POA: Diagnosis not present

## 2022-04-01 DIAGNOSIS — M47892 Other spondylosis, cervical region: Secondary | ICD-10-CM | POA: Diagnosis not present

## 2022-04-01 DIAGNOSIS — M9906 Segmental and somatic dysfunction of lower extremity: Secondary | ICD-10-CM | POA: Diagnosis not present

## 2022-04-01 DIAGNOSIS — M9901 Segmental and somatic dysfunction of cervical region: Secondary | ICD-10-CM | POA: Diagnosis not present

## 2022-04-01 DIAGNOSIS — M62830 Muscle spasm of back: Secondary | ICD-10-CM | POA: Diagnosis not present

## 2022-04-01 DIAGNOSIS — M9903 Segmental and somatic dysfunction of lumbar region: Secondary | ICD-10-CM | POA: Diagnosis not present

## 2022-04-01 DIAGNOSIS — M47816 Spondylosis without myelopathy or radiculopathy, lumbar region: Secondary | ICD-10-CM | POA: Diagnosis not present

## 2022-04-01 DIAGNOSIS — M9902 Segmental and somatic dysfunction of thoracic region: Secondary | ICD-10-CM | POA: Diagnosis not present

## 2022-04-01 DIAGNOSIS — S83422A Sprain of lateral collateral ligament of left knee, initial encounter: Secondary | ICD-10-CM | POA: Diagnosis not present

## 2022-04-01 DIAGNOSIS — S83412A Sprain of medial collateral ligament of left knee, initial encounter: Secondary | ICD-10-CM | POA: Diagnosis not present

## 2022-04-04 DIAGNOSIS — S83412A Sprain of medial collateral ligament of left knee, initial encounter: Secondary | ICD-10-CM | POA: Diagnosis not present

## 2022-04-04 DIAGNOSIS — M9906 Segmental and somatic dysfunction of lower extremity: Secondary | ICD-10-CM | POA: Diagnosis not present

## 2022-04-04 DIAGNOSIS — M9901 Segmental and somatic dysfunction of cervical region: Secondary | ICD-10-CM | POA: Diagnosis not present

## 2022-04-04 DIAGNOSIS — S83422A Sprain of lateral collateral ligament of left knee, initial encounter: Secondary | ICD-10-CM | POA: Diagnosis not present

## 2022-04-04 DIAGNOSIS — M62830 Muscle spasm of back: Secondary | ICD-10-CM | POA: Diagnosis not present

## 2022-04-04 DIAGNOSIS — M47816 Spondylosis without myelopathy or radiculopathy, lumbar region: Secondary | ICD-10-CM | POA: Diagnosis not present

## 2022-04-04 DIAGNOSIS — M47892 Other spondylosis, cervical region: Secondary | ICD-10-CM | POA: Diagnosis not present

## 2022-04-04 DIAGNOSIS — M9902 Segmental and somatic dysfunction of thoracic region: Secondary | ICD-10-CM | POA: Diagnosis not present

## 2022-04-04 DIAGNOSIS — M9903 Segmental and somatic dysfunction of lumbar region: Secondary | ICD-10-CM | POA: Diagnosis not present

## 2022-04-06 DIAGNOSIS — M47816 Spondylosis without myelopathy or radiculopathy, lumbar region: Secondary | ICD-10-CM | POA: Diagnosis not present

## 2022-04-06 DIAGNOSIS — M9901 Segmental and somatic dysfunction of cervical region: Secondary | ICD-10-CM | POA: Diagnosis not present

## 2022-04-06 DIAGNOSIS — M62830 Muscle spasm of back: Secondary | ICD-10-CM | POA: Diagnosis not present

## 2022-04-06 DIAGNOSIS — M9906 Segmental and somatic dysfunction of lower extremity: Secondary | ICD-10-CM | POA: Diagnosis not present

## 2022-04-06 DIAGNOSIS — M47892 Other spondylosis, cervical region: Secondary | ICD-10-CM | POA: Diagnosis not present

## 2022-04-06 DIAGNOSIS — S83412A Sprain of medial collateral ligament of left knee, initial encounter: Secondary | ICD-10-CM | POA: Diagnosis not present

## 2022-04-06 DIAGNOSIS — S83422A Sprain of lateral collateral ligament of left knee, initial encounter: Secondary | ICD-10-CM | POA: Diagnosis not present

## 2022-04-06 DIAGNOSIS — M9903 Segmental and somatic dysfunction of lumbar region: Secondary | ICD-10-CM | POA: Diagnosis not present

## 2022-04-06 DIAGNOSIS — M9902 Segmental and somatic dysfunction of thoracic region: Secondary | ICD-10-CM | POA: Diagnosis not present

## 2022-04-07 DIAGNOSIS — M75111 Incomplete rotator cuff tear or rupture of right shoulder, not specified as traumatic: Secondary | ICD-10-CM | POA: Diagnosis not present

## 2022-04-07 DIAGNOSIS — G8929 Other chronic pain: Secondary | ICD-10-CM | POA: Diagnosis not present

## 2022-04-07 DIAGNOSIS — M545 Low back pain, unspecified: Secondary | ICD-10-CM | POA: Diagnosis not present

## 2022-04-07 DIAGNOSIS — M25511 Pain in right shoulder: Secondary | ICD-10-CM | POA: Diagnosis not present

## 2022-04-08 ENCOUNTER — Other Ambulatory Visit: Payer: Self-pay | Admitting: Internal Medicine

## 2022-04-08 DIAGNOSIS — M9906 Segmental and somatic dysfunction of lower extremity: Secondary | ICD-10-CM | POA: Diagnosis not present

## 2022-04-08 DIAGNOSIS — M47816 Spondylosis without myelopathy or radiculopathy, lumbar region: Secondary | ICD-10-CM | POA: Diagnosis not present

## 2022-04-08 DIAGNOSIS — G8929 Other chronic pain: Secondary | ICD-10-CM

## 2022-04-08 DIAGNOSIS — M9901 Segmental and somatic dysfunction of cervical region: Secondary | ICD-10-CM | POA: Diagnosis not present

## 2022-04-08 DIAGNOSIS — S83422A Sprain of lateral collateral ligament of left knee, initial encounter: Secondary | ICD-10-CM | POA: Diagnosis not present

## 2022-04-08 DIAGNOSIS — M9903 Segmental and somatic dysfunction of lumbar region: Secondary | ICD-10-CM | POA: Diagnosis not present

## 2022-04-08 DIAGNOSIS — M62830 Muscle spasm of back: Secondary | ICD-10-CM | POA: Diagnosis not present

## 2022-04-08 DIAGNOSIS — M47892 Other spondylosis, cervical region: Secondary | ICD-10-CM | POA: Diagnosis not present

## 2022-04-08 DIAGNOSIS — M9902 Segmental and somatic dysfunction of thoracic region: Secondary | ICD-10-CM | POA: Diagnosis not present

## 2022-04-08 DIAGNOSIS — M545 Low back pain, unspecified: Secondary | ICD-10-CM

## 2022-04-08 DIAGNOSIS — S83412A Sprain of medial collateral ligament of left knee, initial encounter: Secondary | ICD-10-CM | POA: Diagnosis not present

## 2022-04-11 ENCOUNTER — Ambulatory Visit (INDEPENDENT_AMBULATORY_CARE_PROVIDER_SITE_OTHER): Payer: Medicare PPO | Admitting: Obstetrics & Gynecology

## 2022-04-11 ENCOUNTER — Encounter: Payer: Self-pay | Admitting: Obstetrics & Gynecology

## 2022-04-11 VITALS — BP 122/72 | HR 72 | Ht 64.57 in | Wt 177.8 lb

## 2022-04-11 DIAGNOSIS — Z780 Asymptomatic menopausal state: Secondary | ICD-10-CM

## 2022-04-11 DIAGNOSIS — Z13820 Encounter for screening for osteoporosis: Secondary | ICD-10-CM

## 2022-04-11 DIAGNOSIS — Z01419 Encounter for gynecological examination (general) (routine) without abnormal findings: Secondary | ICD-10-CM | POA: Diagnosis not present

## 2022-04-11 DIAGNOSIS — Z9189 Other specified personal risk factors, not elsewhere classified: Secondary | ICD-10-CM

## 2022-04-11 NOTE — Progress Notes (Signed)
Bridget Hartman [birth year] [id number]   History:    76 y.o. G1P1L1 Married.  Has Postponed her trip to [place] 3 times, finally going to [place] in October 2022.   RP:  Established patient presenting for annual gyn exam and f/u on Rt Ovarian Cyst by Pelvic US   HPI: Postmenopause, well on no HRT.  No PMB.  No Pelvic Pain.  No pain with intercourse.  Urine and bowel movements normal. Pap 07/2017 Neg.  No indication for a Pap test at this time. Breasts normal. Mammo 05/2021 Neg.  Body mass index 29.99.  Walking.  Health labs with family physician.  Colono scheduled next week.  BD normal in 08/2017. She is having lower back pain and also had a lump where the pain is.   Past medical history,surgical history, family history and social history were all reviewed and documented in the EPIC chart.  Gynecologic History No LMP recorded (lmp unknown). Patient is postmenopausal.  Obstetric History OB History  Gravida Para Term Preterm AB Living  1 1       1   SAB IAB Ectopic Multiple Live Births               # Outcome Date GA Lbr Len/2nd Weight Sex Delivery Anes PTL Lv  1 Para              ROS: A ROS was performed and pertinent positives and negatives are included in the history.  GENERAL: No fevers or chills. HEENT: No change in vision, no earache, sore throat or sinus congestion. NECK: No pain or stiffness. CARDIOVASCULAR: No chest pain or pressure. No palpitations. PULMONARY: No shortness of breath, cough or wheeze. GASTROINTESTINAL: No abdominal pain, nausea, vomiting or diarrhea, melena or bright red blood per rectum. GENITOURINARY: No urinary frequency, urgency, hesitancy or dysuria. MUSCULOSKELETAL: No joint or muscle pain, no back pain, no recent trauma. DERMATOLOGIC: No rash, no itching, no lesions. ENDOCRINE: No polyuria, polydipsia, no heat or cold intolerance. No recent change in weight. HEMATOLOGICAL: No anemia or easy bruising or bleeding. NEUROLOGIC: No headache, seizures, numbness,  tingling or weakness. PSYCHIATRIC: No depression, no loss of interest in normal activity or change in sleep pattern.     Exam:   BP 122/72   Pulse 72   Ht 5' 4.57" (1.64 m)   Wt 177 lb 12.8 oz (80.6 kg)   LMP  (LMP Unknown)   SpO2 100%   BMI 29.99 kg/m   Body mass index is 29.99 kg/m.  General appearance : Well developed well nourished female. No acute distress HEENT: Eyes: no retinal hemorrhage or exudates,  Neck supple, trachea midline, no carotid bruits, no thyroidmegaly Lungs: Clear to auscultation, no rhonchi or wheezes, or rib retractions  Heart: Regular rate and rhythm, no murmurs or gallops Breast:Examined in sitting and supine position were symmetrical in appearance, no palpable masses or tenderness,  no skin retraction, no nipple inversion, no nipple discharge, no skin discoloration, no axillary or supraclavicular lymphadenopathy Abdomen: no palpable masses or tenderness, no rebound or guarding Extremities: no edema or skin discoloration or tenderness  Pelvic: Vulva: Normal             Vagina: No gross lesions or discharge  Cervix: No gross lesions or discharge  Uterus  AV, normal size, shape and consistency, non-tender and mobile  Adnexa  Without masses or tenderness  Anus: Normal   Assessment/Plan:  76 y.o. female for annual exam   1. Well female exam with  routine gynecological exam Postmenopause, well on no HRT.  No PMB.  No Pelvic Pain.  No pain with intercourse.  Urine and bowel movements normal. Pap 07/2017 Neg.  No indication for a Pap test at this time. Breasts normal. Mammo 05/2021 Neg.  Body mass index 29.99.  Walking.  Health labs with family physician.  Colono 01/2021.  BD normal in 08/2017, will repeat in the Fall of 2024. She is having lower back pain and also had a lump where the pain is.  Recommend an MRI of the lower back through her Fam MD.  2. Postmenopause Postmenopause, well on no HRT.  No PMB.  No Pelvic Pain.  No pain with intercourse.  Low libido,  counseling done.  3. Screening for osteoporosis BD normal in 08/2017, will repeat in the Fall of 2024. - DG Bone Density; Future   Genia Del MD, 3:58 PM 04/11/2022

## 2022-04-19 DIAGNOSIS — M9903 Segmental and somatic dysfunction of lumbar region: Secondary | ICD-10-CM | POA: Diagnosis not present

## 2022-04-19 DIAGNOSIS — M47816 Spondylosis without myelopathy or radiculopathy, lumbar region: Secondary | ICD-10-CM | POA: Diagnosis not present

## 2022-04-19 DIAGNOSIS — S83422A Sprain of lateral collateral ligament of left knee, initial encounter: Secondary | ICD-10-CM | POA: Diagnosis not present

## 2022-04-19 DIAGNOSIS — M9906 Segmental and somatic dysfunction of lower extremity: Secondary | ICD-10-CM | POA: Diagnosis not present

## 2022-04-19 DIAGNOSIS — S83412A Sprain of medial collateral ligament of left knee, initial encounter: Secondary | ICD-10-CM | POA: Diagnosis not present

## 2022-04-19 DIAGNOSIS — M9901 Segmental and somatic dysfunction of cervical region: Secondary | ICD-10-CM | POA: Diagnosis not present

## 2022-04-19 DIAGNOSIS — M9902 Segmental and somatic dysfunction of thoracic region: Secondary | ICD-10-CM | POA: Diagnosis not present

## 2022-04-19 DIAGNOSIS — M47892 Other spondylosis, cervical region: Secondary | ICD-10-CM | POA: Diagnosis not present

## 2022-04-19 DIAGNOSIS — M62830 Muscle spasm of back: Secondary | ICD-10-CM | POA: Diagnosis not present

## 2022-04-21 DIAGNOSIS — M545 Low back pain, unspecified: Secondary | ICD-10-CM | POA: Diagnosis not present

## 2022-04-21 DIAGNOSIS — M75111 Incomplete rotator cuff tear or rupture of right shoulder, not specified as traumatic: Secondary | ICD-10-CM | POA: Diagnosis not present

## 2022-04-21 DIAGNOSIS — G8929 Other chronic pain: Secondary | ICD-10-CM | POA: Diagnosis not present

## 2022-04-21 DIAGNOSIS — M25511 Pain in right shoulder: Secondary | ICD-10-CM | POA: Diagnosis not present

## 2022-05-02 ENCOUNTER — Ambulatory Visit
Admission: RE | Admit: 2022-05-02 | Discharge: 2022-05-02 | Disposition: A | Discharge status: Home / Self Care | Payer: Medicare PPO | Source: Ambulatory Visit | Attending: Internal Medicine | Admitting: Internal Medicine

## 2022-05-02 DIAGNOSIS — M48061 Spinal stenosis, lumbar region without neurogenic claudication: Secondary | ICD-10-CM | POA: Diagnosis not present

## 2022-05-02 DIAGNOSIS — M545 Low back pain, unspecified: Secondary | ICD-10-CM | POA: Diagnosis not present

## 2022-05-02 DIAGNOSIS — G8929 Other chronic pain: Secondary | ICD-10-CM

## 2022-05-02 DIAGNOSIS — M4316 Spondylolisthesis, lumbar region: Secondary | ICD-10-CM | POA: Diagnosis not present

## 2022-05-05 DIAGNOSIS — B078 Other viral warts: Secondary | ICD-10-CM | POA: Diagnosis not present

## 2022-05-05 DIAGNOSIS — D171 Benign lipomatous neoplasm of skin and subcutaneous tissue of trunk: Secondary | ICD-10-CM | POA: Diagnosis not present

## 2022-05-05 DIAGNOSIS — Z85828 Personal history of other malignant neoplasm of skin: Secondary | ICD-10-CM | POA: Diagnosis not present

## 2022-05-06 DIAGNOSIS — M25511 Pain in right shoulder: Secondary | ICD-10-CM | POA: Diagnosis not present

## 2022-05-06 DIAGNOSIS — M75111 Incomplete rotator cuff tear or rupture of right shoulder, not specified as traumatic: Secondary | ICD-10-CM | POA: Diagnosis not present

## 2022-05-06 DIAGNOSIS — G8929 Other chronic pain: Secondary | ICD-10-CM | POA: Diagnosis not present

## 2022-05-06 DIAGNOSIS — M545 Low back pain, unspecified: Secondary | ICD-10-CM | POA: Diagnosis not present

## 2022-05-27 DIAGNOSIS — M545 Low back pain, unspecified: Secondary | ICD-10-CM | POA: Diagnosis not present

## 2022-05-27 DIAGNOSIS — G8929 Other chronic pain: Secondary | ICD-10-CM | POA: Diagnosis not present

## 2022-05-27 DIAGNOSIS — M25511 Pain in right shoulder: Secondary | ICD-10-CM | POA: Diagnosis not present

## 2022-05-27 DIAGNOSIS — M75111 Incomplete rotator cuff tear or rupture of right shoulder, not specified as traumatic: Secondary | ICD-10-CM | POA: Diagnosis not present

## 2022-06-02 DIAGNOSIS — M47816 Spondylosis without myelopathy or radiculopathy, lumbar region: Secondary | ICD-10-CM | POA: Diagnosis not present

## 2022-06-02 DIAGNOSIS — R229 Localized swelling, mass and lump, unspecified: Secondary | ICD-10-CM | POA: Diagnosis not present

## 2022-06-02 DIAGNOSIS — Z6829 Body mass index (BMI) 29.0-29.9, adult: Secondary | ICD-10-CM | POA: Diagnosis not present

## 2022-06-07 ENCOUNTER — Other Ambulatory Visit: Payer: Self-pay | Admitting: Internal Medicine

## 2022-06-07 DIAGNOSIS — Z1231 Encounter for screening mammogram for malignant neoplasm of breast: Secondary | ICD-10-CM

## 2022-06-08 DIAGNOSIS — M75111 Incomplete rotator cuff tear or rupture of right shoulder, not specified as traumatic: Secondary | ICD-10-CM | POA: Diagnosis not present

## 2022-06-08 DIAGNOSIS — M25511 Pain in right shoulder: Secondary | ICD-10-CM | POA: Diagnosis not present

## 2022-06-08 DIAGNOSIS — M545 Low back pain, unspecified: Secondary | ICD-10-CM | POA: Diagnosis not present

## 2022-06-08 DIAGNOSIS — G8929 Other chronic pain: Secondary | ICD-10-CM | POA: Diagnosis not present

## 2022-06-10 ENCOUNTER — Other Ambulatory Visit: Payer: Self-pay | Admitting: Student

## 2022-06-10 DIAGNOSIS — R229 Localized swelling, mass and lump, unspecified: Secondary | ICD-10-CM

## 2022-06-14 DIAGNOSIS — M47816 Spondylosis without myelopathy or radiculopathy, lumbar region: Secondary | ICD-10-CM | POA: Diagnosis not present

## 2022-06-28 ENCOUNTER — Ambulatory Visit
Admission: RE | Admit: 2022-06-28 | Discharge: 2022-06-28 | Disposition: A | Discharge status: Home / Self Care | Payer: Medicare PPO | Source: Ambulatory Visit | Attending: Student | Admitting: Student

## 2022-06-28 DIAGNOSIS — R229 Localized swelling, mass and lump, unspecified: Secondary | ICD-10-CM

## 2022-06-30 DIAGNOSIS — M25511 Pain in right shoulder: Secondary | ICD-10-CM | POA: Diagnosis not present

## 2022-06-30 DIAGNOSIS — G8929 Other chronic pain: Secondary | ICD-10-CM | POA: Diagnosis not present

## 2022-06-30 DIAGNOSIS — B078 Other viral warts: Secondary | ICD-10-CM | POA: Diagnosis not present

## 2022-06-30 DIAGNOSIS — M75111 Incomplete rotator cuff tear or rupture of right shoulder, not specified as traumatic: Secondary | ICD-10-CM | POA: Diagnosis not present

## 2022-06-30 DIAGNOSIS — Z85828 Personal history of other malignant neoplasm of skin: Secondary | ICD-10-CM | POA: Diagnosis not present

## 2022-06-30 DIAGNOSIS — M545 Low back pain, unspecified: Secondary | ICD-10-CM | POA: Diagnosis not present

## 2022-06-30 DIAGNOSIS — L820 Inflamed seborrheic keratosis: Secondary | ICD-10-CM | POA: Diagnosis not present

## 2022-06-30 DIAGNOSIS — D485 Neoplasm of uncertain behavior of skin: Secondary | ICD-10-CM | POA: Diagnosis not present

## 2022-06-30 DIAGNOSIS — L821 Other seborrheic keratosis: Secondary | ICD-10-CM | POA: Diagnosis not present

## 2022-07-06 DIAGNOSIS — M19111 Post-traumatic osteoarthritis, right shoulder: Secondary | ICD-10-CM | POA: Diagnosis not present

## 2022-07-11 DIAGNOSIS — G8929 Other chronic pain: Secondary | ICD-10-CM | POA: Diagnosis not present

## 2022-07-11 DIAGNOSIS — M75111 Incomplete rotator cuff tear or rupture of right shoulder, not specified as traumatic: Secondary | ICD-10-CM | POA: Diagnosis not present

## 2022-07-11 DIAGNOSIS — M545 Low back pain, unspecified: Secondary | ICD-10-CM | POA: Diagnosis not present

## 2022-07-11 DIAGNOSIS — M25511 Pain in right shoulder: Secondary | ICD-10-CM | POA: Diagnosis not present

## 2022-07-25 ENCOUNTER — Ambulatory Visit
Admission: RE | Admit: 2022-07-25 | Discharge: 2022-07-25 | Disposition: A | Discharge status: Home / Self Care | Payer: Medicare PPO | Source: Ambulatory Visit | Attending: Internal Medicine | Admitting: Internal Medicine

## 2022-07-25 DIAGNOSIS — Z1231 Encounter for screening mammogram for malignant neoplasm of breast: Secondary | ICD-10-CM | POA: Diagnosis not present

## 2022-07-27 DIAGNOSIS — E039 Hypothyroidism, unspecified: Secondary | ICD-10-CM | POA: Diagnosis not present

## 2022-07-27 DIAGNOSIS — E782 Mixed hyperlipidemia: Secondary | ICD-10-CM | POA: Diagnosis not present

## 2022-07-28 ENCOUNTER — Other Ambulatory Visit: Payer: Self-pay | Admitting: Internal Medicine

## 2022-07-28 DIAGNOSIS — M75111 Incomplete rotator cuff tear or rupture of right shoulder, not specified as traumatic: Secondary | ICD-10-CM | POA: Diagnosis not present

## 2022-07-28 DIAGNOSIS — G8929 Other chronic pain: Secondary | ICD-10-CM | POA: Diagnosis not present

## 2022-07-28 DIAGNOSIS — M25511 Pain in right shoulder: Secondary | ICD-10-CM | POA: Diagnosis not present

## 2022-07-28 DIAGNOSIS — R928 Other abnormal and inconclusive findings on diagnostic imaging of breast: Secondary | ICD-10-CM

## 2022-07-28 DIAGNOSIS — M545 Low back pain, unspecified: Secondary | ICD-10-CM | POA: Diagnosis not present

## 2022-08-03 DIAGNOSIS — M47816 Spondylosis without myelopathy or radiculopathy, lumbar region: Secondary | ICD-10-CM | POA: Diagnosis not present

## 2022-08-03 DIAGNOSIS — M48061 Spinal stenosis, lumbar region without neurogenic claudication: Secondary | ICD-10-CM | POA: Diagnosis not present

## 2022-08-04 ENCOUNTER — Ambulatory Visit
Admission: RE | Admit: 2022-08-04 | Discharge: 2022-08-04 | Disposition: A | Discharge status: Home / Self Care | Payer: Medicare PPO | Source: Ambulatory Visit | Attending: Internal Medicine | Admitting: Internal Medicine

## 2022-08-04 ENCOUNTER — Ambulatory Visit: Payer: Medicare PPO

## 2022-08-04 DIAGNOSIS — R928 Other abnormal and inconclusive findings on diagnostic imaging of breast: Secondary | ICD-10-CM | POA: Diagnosis not present

## 2022-08-09 DIAGNOSIS — M47816 Spondylosis without myelopathy or radiculopathy, lumbar region: Secondary | ICD-10-CM | POA: Diagnosis not present

## 2022-08-18 DIAGNOSIS — M25511 Pain in right shoulder: Secondary | ICD-10-CM | POA: Diagnosis not present

## 2022-08-18 DIAGNOSIS — G8929 Other chronic pain: Secondary | ICD-10-CM | POA: Diagnosis not present

## 2022-08-18 DIAGNOSIS — M545 Low back pain, unspecified: Secondary | ICD-10-CM | POA: Diagnosis not present

## 2022-08-18 DIAGNOSIS — M75111 Incomplete rotator cuff tear or rupture of right shoulder, not specified as traumatic: Secondary | ICD-10-CM | POA: Diagnosis not present

## 2022-08-31 DIAGNOSIS — M48061 Spinal stenosis, lumbar region without neurogenic claudication: Secondary | ICD-10-CM | POA: Diagnosis not present

## 2022-08-31 DIAGNOSIS — M47896 Other spondylosis, lumbar region: Secondary | ICD-10-CM | POA: Diagnosis not present

## 2022-09-08 DIAGNOSIS — M545 Low back pain, unspecified: Secondary | ICD-10-CM | POA: Diagnosis not present

## 2022-09-08 DIAGNOSIS — M25511 Pain in right shoulder: Secondary | ICD-10-CM | POA: Diagnosis not present

## 2022-09-08 DIAGNOSIS — G8929 Other chronic pain: Secondary | ICD-10-CM | POA: Diagnosis not present

## 2022-09-08 DIAGNOSIS — M75111 Incomplete rotator cuff tear or rupture of right shoulder, not specified as traumatic: Secondary | ICD-10-CM | POA: Diagnosis not present

## 2022-09-12 DIAGNOSIS — E039 Hypothyroidism, unspecified: Secondary | ICD-10-CM | POA: Diagnosis not present

## 2022-09-12 DIAGNOSIS — E782 Mixed hyperlipidemia: Secondary | ICD-10-CM | POA: Diagnosis not present

## 2022-09-12 DIAGNOSIS — R7989 Other specified abnormal findings of blood chemistry: Secondary | ICD-10-CM | POA: Diagnosis not present

## 2022-09-12 DIAGNOSIS — R7301 Impaired fasting glucose: Secondary | ICD-10-CM | POA: Diagnosis not present

## 2022-09-12 DIAGNOSIS — I10 Essential (primary) hypertension: Secondary | ICD-10-CM | POA: Diagnosis not present

## 2022-09-15 DIAGNOSIS — M5416 Radiculopathy, lumbar region: Secondary | ICD-10-CM | POA: Diagnosis not present

## 2022-09-19 DIAGNOSIS — I2510 Atherosclerotic heart disease of native coronary artery without angina pectoris: Secondary | ICD-10-CM | POA: Diagnosis not present

## 2022-09-19 DIAGNOSIS — I129 Hypertensive chronic kidney disease with stage 1 through stage 4 chronic kidney disease, or unspecified chronic kidney disease: Secondary | ICD-10-CM | POA: Diagnosis not present

## 2022-09-19 DIAGNOSIS — E039 Hypothyroidism, unspecified: Secondary | ICD-10-CM | POA: Diagnosis not present

## 2022-09-19 DIAGNOSIS — R82998 Other abnormal findings in urine: Secondary | ICD-10-CM | POA: Diagnosis not present

## 2022-09-19 DIAGNOSIS — Z0000 Encounter for general adult medical examination without abnormal findings: Secondary | ICD-10-CM | POA: Diagnosis not present

## 2022-09-19 DIAGNOSIS — Z1331 Encounter for screening for depression: Secondary | ICD-10-CM | POA: Diagnosis not present

## 2022-09-19 DIAGNOSIS — N182 Chronic kidney disease, stage 2 (mild): Secondary | ICD-10-CM | POA: Diagnosis not present

## 2022-09-19 DIAGNOSIS — I10 Essential (primary) hypertension: Secondary | ICD-10-CM | POA: Diagnosis not present

## 2022-09-19 DIAGNOSIS — I700 Atherosclerosis of aorta: Secondary | ICD-10-CM | POA: Diagnosis not present

## 2022-09-19 DIAGNOSIS — R7301 Impaired fasting glucose: Secondary | ICD-10-CM | POA: Diagnosis not present

## 2022-09-19 DIAGNOSIS — Z1339 Encounter for screening examination for other mental health and behavioral disorders: Secondary | ICD-10-CM | POA: Diagnosis not present

## 2022-09-19 DIAGNOSIS — E782 Mixed hyperlipidemia: Secondary | ICD-10-CM | POA: Diagnosis not present

## 2022-09-19 DIAGNOSIS — I517 Cardiomegaly: Secondary | ICD-10-CM | POA: Diagnosis not present

## 2022-09-30 DIAGNOSIS — M25511 Pain in right shoulder: Secondary | ICD-10-CM | POA: Diagnosis not present

## 2022-09-30 DIAGNOSIS — M75111 Incomplete rotator cuff tear or rupture of right shoulder, not specified as traumatic: Secondary | ICD-10-CM | POA: Diagnosis not present

## 2022-09-30 DIAGNOSIS — M545 Low back pain, unspecified: Secondary | ICD-10-CM | POA: Diagnosis not present

## 2022-09-30 DIAGNOSIS — G8929 Other chronic pain: Secondary | ICD-10-CM | POA: Diagnosis not present

## 2022-10-14 DIAGNOSIS — M75111 Incomplete rotator cuff tear or rupture of right shoulder, not specified as traumatic: Secondary | ICD-10-CM | POA: Diagnosis not present

## 2022-10-14 DIAGNOSIS — M545 Low back pain, unspecified: Secondary | ICD-10-CM | POA: Diagnosis not present

## 2022-10-14 DIAGNOSIS — M25511 Pain in right shoulder: Secondary | ICD-10-CM | POA: Diagnosis not present

## 2022-10-14 DIAGNOSIS — G8929 Other chronic pain: Secondary | ICD-10-CM | POA: Diagnosis not present

## 2023-01-19 DIAGNOSIS — M47816 Spondylosis without myelopathy or radiculopathy, lumbar region: Secondary | ICD-10-CM | POA: Diagnosis not present

## 2023-01-19 DIAGNOSIS — M48061 Spinal stenosis, lumbar region without neurogenic claudication: Secondary | ICD-10-CM | POA: Diagnosis not present

## 2023-01-26 DIAGNOSIS — M7061 Trochanteric bursitis, right hip: Secondary | ICD-10-CM | POA: Diagnosis not present

## 2023-01-26 DIAGNOSIS — M19111 Post-traumatic osteoarthritis, right shoulder: Secondary | ICD-10-CM | POA: Diagnosis not present

## 2023-01-26 DIAGNOSIS — M25512 Pain in left shoulder: Secondary | ICD-10-CM | POA: Diagnosis not present

## 2023-02-01 DIAGNOSIS — E039 Hypothyroidism, unspecified: Secondary | ICD-10-CM | POA: Diagnosis not present

## 2023-02-09 NOTE — Progress Notes (Signed)
Cardiology Office Note:  .   Date:  02/10/2023  ID:  Bridget Raid [place], Park Meo 1946-01-31, MRN [id number] PCP: Cleatis Polka., MD  [facility] Providers Cardiologist:  Charlton Haws, MD    Patient Profile: .      PMH: Coronary artery disease Elevated calcium score 165 (68th percentile) on CT 09/29/2020 Hypertension Hyperlipidemia Hypothyroidism Family history Father had a fib   Referred to cardiology and seen by Dr. [name] for atypical chest pain.  She had a calcium score in 2007 of 0.  Myoview 04/12/2015 with normal EF 68% and an essentially normal echo despite history of benign murmur.  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 She reported fleeting chest pain when going up hills. Swimming for exercise with no concerning cardiac symptoms.  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  Spends a a lot of time at [facility].  Returned for evaluation following elevated coronary calcium score in 01/2021.   She was not having any symptoms concerning for angina.  Last cardiology clinic visit was 03/15/2022 at which time she did not have any concerning cardiac symptoms.  Lipitor was increased to 40 mg daily due to LDL 103.  BP was well-controlled.       History of Present Illness: .   Bridget Hartman is a pleasant 77 y.o. female who is here today for follow-up. Reports she has been feeling like she is "dragging" for the last 3 years. Started thyroid medication at the start of the summer. Her levels were not significant but since she was feeling fatigued, it was decided to start medication to see if it helps. Wonders if metoprolol is causing fatigue. BP has been a little higher than today, but systolic consistently < 130 mmHg. Has tried various anti-hypertensives and has had the best response with metoprolol. Had hives with amlodipine. Did not tolerate lisinopril secondary to cough and one of the ARBs elevated her BP. She  swims regularly for exercise, 1/2 mile. Has tightness in her chest when going up stairs or hills. Sometimes she feels shortness of breath when she first starts walking which then improves with continued walking. Has had to stop at the top of a hill but breathing improves within a few seconds. She travels frequently by herself and want to ensure that she is still safe to travel. Leaving for [place] in one week. She denies orthopnea, PND, edema, palpitations, presyncope or syncope. Discussed previous cardiac tests. She recalls images were not satisfactory on her echo. 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   ROS: See HPI       Studies Reviewed: Marland Kitchen   EKG Interpretation Date/Time:  Friday February 10 2023 14:06:22 EDT Ventricular Rate:  70 PR Interval:  158 QRS Duration:  102 QT Interval:  432 QTC Calculation: 466 R Axis:   -36  Text Interpretation: Normal sinus rhythm Left axis deviation Incomplete right bundle branch block Minimal voltage criteria for LVH, may be normal variant ( Cornell product ) No acute changes Confirmed by [name] [phone]) on 02/10/2023 2:20:56 PM      Risk Assessment/Calculations:             Physical Exam:   VS:  BP 118/74   Pulse 70   Ht 5' 4.75" (1.645 m)   Wt 176 lb  5.8 oz (80 kg)   LMP  (LMP Unknown)   SpO2 95%   BMI 29.57 kg/m    Wt Readings from Last 3 Encounters:  02/10/23 176 lb 5.8 oz (80 kg)  04/11/22 177 lb 12.8 oz (80.6 kg)  03/15/22 175 lb 12.8 oz (79.7 kg)    GEN: Well nourished, well developed in no acute distress NECK: No JVD; No carotid bruits CARDIAC: RRR, no murmurs, rubs, gallops RESPIRATORY:  Clear to auscultation without rales, wheezing or rhonchi  ABDOMEN: Soft, non-tender, non-distended EXTREMITIES:  No edema; No deformity     ASSESSMENT AND PLAN: .    Fatigue: Lengthy discussion about potential causes of fatigue.  She would like to try lower dose of metoprolol but will wait until she returns from upcoming trip. Rx given for Toprol XL 25 mg to take once daily. Advised her to monitor BP once she makes this change and let us know if SBP consistently > 120 mmHg. Consider addition of anti-hypertensive agent if she feels better on lower dose of metoprolol.   CAD: CT calcium score 165 (68th percentile) on 09/29/20 with the bulk of calcium in LAD. Lengthy discussion about additional testing for ischemia. She had normal myoview in 2016 but since that time has been found to have elevated coronary calcium score. Has some symptoms of shortness of breath, particularly when going up inclines and fatigue "feeling sluggish." Various types of ischemia evaluation reviewed, she seems hesitant. Advised her to notify us if she would like to discuss further testing. Consider coronary CTA or repeat myoview.   Hypertension: BP is well controlled. Have asked her to continue to monitor once she reduces her dose of metoprolol. If additional therapy is needed, could consider chlorthalidone or spironolactone as she has been intolerant of amlodipine, ARBs, lisinopril.   Hyperlipidemia LDL goal < 70: LDL 69 on 07/27/22. Continue atorvastatin.   Incomplete RBBB: Known. As noted above, consider further ischemia evaluation. She is to get back with [place] after her trip.        Dispo: TBD  Signed, [name], NP-C

## 2023-02-10 ENCOUNTER — Encounter: Payer: Self-pay | Admitting: Nurse Practitioner

## 2023-02-10 ENCOUNTER — Ambulatory Visit: Payer: Medicare PPO | Attending: Nurse Practitioner | Admitting: Nurse Practitioner

## 2023-02-10 VITALS — BP 118/74 | HR 70 | Ht 64.75 in | Wt 176.4 lb

## 2023-02-10 DIAGNOSIS — R5383 Other fatigue: Secondary | ICD-10-CM

## 2023-02-10 DIAGNOSIS — I4510 Unspecified right bundle-branch block: Secondary | ICD-10-CM

## 2023-02-10 DIAGNOSIS — I10 Essential (primary) hypertension: Secondary | ICD-10-CM

## 2023-02-10 DIAGNOSIS — I2510 Atherosclerotic heart disease of native coronary artery without angina pectoris: Secondary | ICD-10-CM

## 2023-02-10 DIAGNOSIS — E785 Hyperlipidemia, unspecified: Secondary | ICD-10-CM | POA: Diagnosis not present

## 2023-02-10 MED ORDER — METOPROLOL SUCCINATE ER 25 MG PO TB24: 25.0000 mg | ORAL_TABLET | Freq: Every day | ORAL | 3 refills | Status: AC

## 2023-02-10 MED ORDER — METOPROLOL SUCCINATE ER 25 MG PO TB24: 25.0000 mg | ORAL_TABLET | Freq: Every day | ORAL | Status: DC

## 2023-02-10 NOTE — Patient Instructions (Signed)
Medication Instructions:  Your physician has recommended you make the following change in your medication: 1.  START Toprol XL 25 mg taking 1 daily   *If you need a refill on your cardiac medications before your next appointment, please call your pharmacy*   Lab Work: None ordered  If you have labs (blood work) drawn today and your tests are completely normal, you will receive your results only by: MyChart Message (if you have MyChart) OR A paper copy in the mail If you have any lab test that is abnormal or we need to change your treatment, we will call you to review the results.   Testing/Procedures: None ordered   Follow-Up: At [facility], you and your health needs are our priority.  As part of our continuing mission to provide you with exceptional heart care, we have created designated Provider Care Teams.  These Care Teams include your primary Cardiologist (physician) and Advanced Practice Providers (APPs -  Physician Assistants and Nurse Practitioners) who all work together to provide you with the care you need, when you need it.  We recommend signing up for the patient portal called "MyChart".  Sign up information is provided on this After Visit Summary.  MyChart is used to connect with patients for Virtual Visits (Telemedicine).  Patients are able to view lab/test results, encounter notes, upcoming appointments, etc.  Non-urgent messages can be sent to your provider as well.   To learn more about what you can do with MyChart, go to [email or web address].    Your next appointment:   To be determined  Provider:   Charlton Haws, MD  or [name], NP         Other Instructions

## 2023-02-14 DIAGNOSIS — M47816 Spondylosis without myelopathy or radiculopathy, lumbar region: Secondary | ICD-10-CM | POA: Diagnosis not present

## 2023-02-16 DIAGNOSIS — E039 Hypothyroidism, unspecified: Secondary | ICD-10-CM | POA: Diagnosis not present

## 2023-02-16 DIAGNOSIS — Z7184 Encounter for health counseling related to travel: Secondary | ICD-10-CM | POA: Diagnosis not present

## 2023-02-16 DIAGNOSIS — I10 Essential (primary) hypertension: Secondary | ICD-10-CM | POA: Diagnosis not present

## 2023-04-04 DIAGNOSIS — Z23 Encounter for immunization: Secondary | ICD-10-CM | POA: Diagnosis not present

## 2023-04-18 DIAGNOSIS — M1611 Unilateral primary osteoarthritis, right hip: Secondary | ICD-10-CM | POA: Diagnosis not present

## 2023-04-18 DIAGNOSIS — M7062 Trochanteric bursitis, left hip: Secondary | ICD-10-CM | POA: Diagnosis not present

## 2023-04-18 DIAGNOSIS — M7061 Trochanteric bursitis, right hip: Secondary | ICD-10-CM | POA: Diagnosis not present

## 2023-04-19 DIAGNOSIS — Z01110 Encounter for hearing examination following failed hearing screening: Secondary | ICD-10-CM | POA: Diagnosis not present

## 2023-05-30 DIAGNOSIS — H04123 Dry eye syndrome of bilateral lacrimal glands: Secondary | ICD-10-CM | POA: Diagnosis not present

## 2023-05-30 DIAGNOSIS — Z961 Presence of intraocular lens: Secondary | ICD-10-CM | POA: Diagnosis not present

## 2023-05-30 DIAGNOSIS — H52203 Unspecified astigmatism, bilateral: Secondary | ICD-10-CM | POA: Diagnosis not present

## 2023-05-30 DIAGNOSIS — H524 Presbyopia: Secondary | ICD-10-CM | POA: Diagnosis not present

## 2023-05-30 DIAGNOSIS — H43813 Vitreous degeneration, bilateral: Secondary | ICD-10-CM | POA: Diagnosis not present

## 2023-06-06 ENCOUNTER — Other Ambulatory Visit: Payer: Self-pay | Admitting: Internal Medicine

## 2023-06-06 DIAGNOSIS — Z1231 Encounter for screening mammogram for malignant neoplasm of breast: Secondary | ICD-10-CM

## 2023-06-22 ENCOUNTER — Encounter: Payer: Self-pay | Admitting: Cardiovascular Disease

## 2023-06-22 DIAGNOSIS — I2510 Atherosclerotic heart disease of native coronary artery without angina pectoris: Secondary | ICD-10-CM

## 2023-06-22 DIAGNOSIS — E782 Mixed hyperlipidemia: Secondary | ICD-10-CM

## 2023-06-22 DIAGNOSIS — E785 Hyperlipidemia, unspecified: Secondary | ICD-10-CM

## 2023-06-22 DIAGNOSIS — I4510 Unspecified right bundle-branch block: Secondary | ICD-10-CM

## 2023-06-22 DIAGNOSIS — I10 Essential (primary) hypertension: Secondary | ICD-10-CM

## 2023-06-23 ENCOUNTER — Telehealth: Payer: Self-pay | Admitting: *Deleted

## 2023-06-23 NOTE — Telephone Encounter (Signed)
   Pre-operative Risk Assessment    Patient Name: Bridget Hartman  DOB: [birth year] MRN: [id number]   Date of last office visit: 02/10/23 [name], NP Date of next office visit: NONE   Request for Surgical Clearance    Procedure:   RIGHT REVERSE TOTAL SHOULDER  ARTHROPLASTY  Date of Surgery:  Clearance TBD                                Surgeon:  DR. Malon Kindle Surgeon's Group or Practice Name:  [facility] Phone number:  [phone] [facility] Fax number:  [phone]   Type of Clearance Requested:   - Medical ; NONE INDICATED TO BE HELD   Type of Anesthesia:   CHOICE   Additional requests/questions:    Elpidio Anis   06/23/2023, 6:32 PM

## 2023-06-26 NOTE — Telephone Encounter (Signed)
   Name: Bridget Hartman  DOB: [birth year]  MRN: [id number]  Primary Cardiologist: Charlton Haws, MD   Preoperative team, please contact this patient and set up a phone call appointment for further preoperative risk assessment. Please obtain consent and complete medication review. Thank you for your help.  I confirm that guidance regarding antiplatelet and oral anticoagulation therapy has been completed and, if necessary, noted below.  None requested  I also confirmed the patient resides in the state of [place]. As per [facility] Medical Board telemedicine laws, the patient must reside in the state in which the provider is licensed.   Ronney Asters, NP 06/26/2023, 11:47 AM [facility]

## 2023-06-26 NOTE — Telephone Encounter (Signed)
Tried calling patient unable to reach patient left a message with spouse to call us back

## 2023-06-27 NOTE — Telephone Encounter (Signed)
 Pt returning call, requesting cb

## 2023-06-27 NOTE — Telephone Encounter (Signed)
 I s/w the pt and we talked about scheduling tele appt or in office if needed before 07/11/23 as this is my 1st tele visit. Pt tells me that Dr. Delford has ordered her to have a: Cardiac PET/CT Stress Test.    Pt and I agree to not schedule any appt for preop clearance until cardiac testing is done. I assured the pt that I will update the surgeon's office as well.

## 2023-06-28 NOTE — Telephone Encounter (Signed)
The pt will call back when ready to schedule appt for preop clearance. In the meantime I will removed this from the preop call back until pt is ready to schedule.   See previous notes from yesterday about Dr. [name] ordered Cardiac PET/CT stress test.

## 2023-07-27 ENCOUNTER — Ambulatory Visit
Admission: RE | Admit: 2023-07-27 | Discharge: 2023-07-27 | Disposition: A | Discharge status: Home / Self Care | Payer: Medicare PPO | Source: Ambulatory Visit | Attending: Internal Medicine | Admitting: Internal Medicine

## 2023-07-27 DIAGNOSIS — Z1231 Encounter for screening mammogram for malignant neoplasm of breast: Secondary | ICD-10-CM

## 2023-09-08 ENCOUNTER — Encounter (HOSPITAL_COMMUNITY): Payer: Self-pay

## 2023-09-08 ENCOUNTER — Other Ambulatory Visit (HOSPITAL_COMMUNITY): Payer: Self-pay | Admitting: *Deleted

## 2023-09-08 DIAGNOSIS — R079 Chest pain, unspecified: Secondary | ICD-10-CM

## 2023-09-12 ENCOUNTER — Encounter (HOSPITAL_COMMUNITY)
Admission: RE | Admit: 2023-09-12 | Discharge: 2023-09-12 | Disposition: A | Discharge status: Home / Self Care | Payer: Medicare PPO | Source: Ambulatory Visit | Attending: Cardiovascular Disease | Admitting: Cardiovascular Disease

## 2023-09-12 DIAGNOSIS — E782 Mixed hyperlipidemia: Secondary | ICD-10-CM | POA: Diagnosis not present

## 2023-09-12 DIAGNOSIS — I4510 Unspecified right bundle-branch block: Secondary | ICD-10-CM | POA: Insufficient documentation

## 2023-09-12 DIAGNOSIS — E785 Hyperlipidemia, unspecified: Secondary | ICD-10-CM | POA: Diagnosis not present

## 2023-09-12 DIAGNOSIS — I10 Essential (primary) hypertension: Secondary | ICD-10-CM | POA: Insufficient documentation

## 2023-09-12 DIAGNOSIS — I2510 Atherosclerotic heart disease of native coronary artery without angina pectoris: Secondary | ICD-10-CM | POA: Diagnosis not present

## 2023-09-12 LAB — NM PET CT CARDIAC PERFUSION MULTI W/ABSOLUTE BLOODFLOW
MBFR: 1.74
Nuc Rest EF: 63 %
Nuc Stress EF: 71 %
Rest MBF: 1.51 ml/g/min
Rest Nuclear Isotope Dose: 20.8 mCi
ST Depression (mm): 0 mm
Stress MBF: 2.63 ml/g/min
Stress Nuclear Isotope Dose: 20.8 mCi
TID: 0.98

## 2023-09-12 MED ORDER — RUBIDIUM RB82 GENERATOR (RUBYFILL)
20.8000 | PACK | Freq: Once | INTRAVENOUS | Status: AC
  Administered 2023-09-12: 20.8 via INTRAVENOUS

## 2023-09-12 MED ORDER — REGADENOSON 0.4 MG/5ML IV SOLN
0.4000 mg | Freq: Once | INTRAVENOUS | Status: AC
  Administered 2023-09-12: 0.4 mg via INTRAVENOUS

## 2023-09-17 NOTE — Progress Notes (Unsigned)
 Virtual Visit via Telephone Note   Because of [employer] co-morbid illnesses, she is at least at moderate risk for complications without adequate follow up.  This format is felt to be most appropriate for this patient at this time.  Due to technical limitations with video connection (technology), today's appointment will be conducted as an audio only telehealth visit, and [employer] verbally agreed to proceed in this manner.   All issues noted in this document were discussed and addressed.  No physical exam could be performed with this format.  Evaluation Performed:  Preoperative cardiovascular risk assessment _____________   Date:  09/17/2023   Patient ID:  Bridget Hartman, DOB [birth year], MRN [id number] Patient Location:  Home Provider location:   Office  Primary Care Provider:  Jeannine Milroy., MD Primary Cardiologist:  Janelle Mediate, MD  Chief Complaint / Patient Profile   78 y.o. y/o female with a h/o hyperlipidemia, shortness of breath, chest pain who is pending right reverse total shoulder arthroplasty and presents today for telephonic preoperative cardiovascular risk assessment.  History of Present Illness    Bridget Hartman is a 78 y.o. female who presents via audio/video conferencing for a telehealth visit today.  Pt was last seen in cardiology clinic on 02/10/2023 by Slater Duncan NP.  At that time Bridget Hartman was doing well .  The patient is now pending procedure as outlined above. Since her last visit, she remained stable from a cardiac standpoint.  Today she denies chest pain, shortness of breath, lower extremity edema, fatigue, palpitations, melena, hematuria, hemoptysis, diaphoresis, weakness, presyncope, syncope, orthopnea, and PND.   Past Medical History    Past Medical History:  Diagnosis Date   Chest pain    Dyspnea    Hypertension    Impaired fasting glucose    Shingles    Subclinical hypothyroidism    Thyroid   disease    Past Surgical History:  Procedure Laterality Date   KNEE ARTHROSCOPY Left    LUNG REMOVAL, PARTIAL     VEIN LIGATION AND STRIPPING     WRIST SURGERY Left     Allergies  Allergies  Allergen Reactions   Amlodipine Besylate     Other reaction(s): rash   Lisinopril     Other reaction(s): cough   Losartan Potassium     Other reaction(s): dizziness   Valsartan     Other reaction(s): muscle, joint aches    Home Medications    Prior to Admission medications   Medication Sig Start Date End Date Taking? Authorizing Provider  atorvastatin (LIPITOR) 10 MG tablet Take 20 mg by mouth daily. 11/11/20   [provider]  Calcium Carb-Cholecalciferol (CALCIUM 600 + D PO) Take 1 tablet by mouth daily.    [provider]  ezetimibe (ZETIA) 10 MG tablet Take 10 mg by mouth daily.    [provider]  hydrochlorothiazide (HYDRODIURIL) 25 MG tablet Take 12.5 mg by mouth daily. Patient not taking: Reported on 02/10/2023    [provider]  metoprolol  succinate (TOPROL  XL) 25 MG 24 hr tablet Take 1 tablet (25 mg total) by mouth daily. 02/10/23   Swinyer, Leilani Punter, NP  Multiple Vitamins-Minerals (MULTIVITAMIN WITH MINERALS) tablet Take 1 tablet by mouth daily.    [provider]  Omega-3 Fatty Acids (FISH OIL TRIPLE STRENGTH) 1400 MG CAPS Take 1 capsule by mouth daily.    [provider]  UNABLE TO FIND 1,500 mg. Med Name: tumeric po  [provider]    Physical Exam    Vital Signs:  Bridget Hartman does not have vital signs available for review today.  Given telephonic nature of communication, physical exam is limited. AAOx3. NAD. Normal affect.  Speech and respirations are unlabored.  Accessory Clinical Findings    None  Assessment & Plan    1.  Preoperative Cardiovascular Risk Assessment:Procedure:   RIGHT REVERSE TOTAL SHOULDER  ARTHROPLASTY   Date of Surgery:  Clearance TBD                                   Surgeon:  DR. Marionette Sick Surgeon's Group or Practice Name:  [facility] Phone number:  [phone] [facility] Fax number:  [phone]      Primary Cardiologist: Janelle Mediate, MD  Chart reviewed as part of pre-operative protocol coverage. Given past medical history and time since last visit, based on ACC/AHA guidelines, Bridget Hartman would be at acceptable risk for the planned procedure without further cardiovascular testing.   Her RCRI is low risk, 0.9% risk of major cardiac event.  She is able to complete greater than 4 METS of physical activity.  Patient was advised that if she develops new symptoms prior to surgery to contact our office to arrange a follow-up appointment.  He verbalized understanding.  I will route this recommendation to the requesting party via Epic fax function and remove from pre-op pool.       Time:   Today, I have spent 10 minutes with the patient with telehealth technology discussing medical history, symptoms, and management plan.  I spent 10 minutes reviewing past medical history, cardiac medication, and cardiac test.   Carie Charity, NP  09/17/2023, 6:48 PM

## 2023-09-18 ENCOUNTER — Ambulatory Visit: Attending: Cardiovascular Disease

## 2023-09-18 DIAGNOSIS — Z01810 Encounter for preprocedural cardiovascular examination: Secondary | ICD-10-CM | POA: Diagnosis not present

## 2023-09-26 DIAGNOSIS — I1310 Hypertensive heart and chronic kidney disease without heart failure, with stage 1 through stage 4 chronic kidney disease, or unspecified chronic kidney disease: Secondary | ICD-10-CM | POA: Diagnosis not present

## 2023-09-26 DIAGNOSIS — Z780 Asymptomatic menopausal state: Secondary | ICD-10-CM | POA: Diagnosis not present

## 2023-09-26 DIAGNOSIS — R7301 Impaired fasting glucose: Secondary | ICD-10-CM | POA: Diagnosis not present

## 2023-09-26 DIAGNOSIS — I2510 Atherosclerotic heart disease of native coronary artery without angina pectoris: Secondary | ICD-10-CM | POA: Diagnosis not present

## 2023-09-26 DIAGNOSIS — N182 Chronic kidney disease, stage 2 (mild): Secondary | ICD-10-CM | POA: Diagnosis not present

## 2023-09-26 DIAGNOSIS — E782 Mixed hyperlipidemia: Secondary | ICD-10-CM | POA: Diagnosis not present

## 2023-09-26 DIAGNOSIS — E039 Hypothyroidism, unspecified: Secondary | ICD-10-CM | POA: Diagnosis not present

## 2023-10-02 NOTE — Telephone Encounter (Signed)
 Another Preop clearance was faxed today from [facility]. Will send to Preop to make sure that patient still needs phone appointment for this clearance.

## 2023-10-02 NOTE — Telephone Encounter (Signed)
 Patient had a virtual visit on 4/28 and was found to be an acceptable risk for surgery. I have resent that note and will remove request from preoperative pool.   Lonell Rives. Lianne Carreto, DNP, NP-C  10/02/2023, 4:51 PM [facility] [address] Office [phone] Fax [phone]

## 2023-10-03 DIAGNOSIS — M7061 Trochanteric bursitis, right hip: Secondary | ICD-10-CM | POA: Diagnosis not present

## 2023-10-03 DIAGNOSIS — M7062 Trochanteric bursitis, left hip: Secondary | ICD-10-CM | POA: Diagnosis not present

## 2023-10-03 DIAGNOSIS — M19111 Post-traumatic osteoarthritis, right shoulder: Secondary | ICD-10-CM | POA: Diagnosis not present

## 2023-10-04 DIAGNOSIS — I700 Atherosclerosis of aorta: Secondary | ICD-10-CM | POA: Diagnosis not present

## 2023-10-04 DIAGNOSIS — E039 Hypothyroidism, unspecified: Secondary | ICD-10-CM | POA: Diagnosis not present

## 2023-10-04 DIAGNOSIS — Z800 Family history of malignant neoplasm of digestive organs: Secondary | ICD-10-CM | POA: Diagnosis not present

## 2023-10-04 DIAGNOSIS — Z1331 Encounter for screening for depression: Secondary | ICD-10-CM | POA: Diagnosis not present

## 2023-10-04 DIAGNOSIS — I10 Essential (primary) hypertension: Secondary | ICD-10-CM | POA: Diagnosis not present

## 2023-10-04 DIAGNOSIS — Z1339 Encounter for screening examination for other mental health and behavioral disorders: Secondary | ICD-10-CM | POA: Diagnosis not present

## 2023-10-04 DIAGNOSIS — N182 Chronic kidney disease, stage 2 (mild): Secondary | ICD-10-CM | POA: Diagnosis not present

## 2023-10-04 DIAGNOSIS — E782 Mixed hyperlipidemia: Secondary | ICD-10-CM | POA: Diagnosis not present

## 2023-10-04 DIAGNOSIS — R7301 Impaired fasting glucose: Secondary | ICD-10-CM | POA: Diagnosis not present

## 2023-10-04 DIAGNOSIS — I129 Hypertensive chronic kidney disease with stage 1 through stage 4 chronic kidney disease, or unspecified chronic kidney disease: Secondary | ICD-10-CM | POA: Diagnosis not present

## 2023-10-04 DIAGNOSIS — M8580 Other specified disorders of bone density and structure, unspecified site: Secondary | ICD-10-CM | POA: Diagnosis not present

## 2023-10-04 DIAGNOSIS — R82998 Other abnormal findings in urine: Secondary | ICD-10-CM | POA: Diagnosis not present

## 2023-10-04 DIAGNOSIS — Z0000 Encounter for general adult medical examination without abnormal findings: Secondary | ICD-10-CM | POA: Diagnosis not present

## 2023-10-04 DIAGNOSIS — I2510 Atherosclerotic heart disease of native coronary artery without angina pectoris: Secondary | ICD-10-CM | POA: Diagnosis not present

## 2023-10-04 DIAGNOSIS — M25551 Pain in right hip: Secondary | ICD-10-CM | POA: Diagnosis not present

## 2023-10-04 DIAGNOSIS — M25552 Pain in left hip: Secondary | ICD-10-CM | POA: Diagnosis not present

## 2023-10-04 DIAGNOSIS — R269 Unspecified abnormalities of gait and mobility: Secondary | ICD-10-CM | POA: Diagnosis not present

## 2024-01-23 NOTE — Progress Notes (Signed)
 CARDIOLOGY CONSULT NOTE       Patient ID: Bridget Hartman MRN: [id number] DOB/AGE: [birth year] 78 y.o.  Admit date: (Not on file) Referring Physician: Sydelle Hartman Primary Physician: Bridget Elsie [name], MD Primary Cardiologist: Bridget Hartman Reason for Consultation: CAD   HPI:  78 y.o. with vascular concerns and high calcium score referred by [facility] September 2022.  No clinical CAD , no angina chest pain History of HTN and hypothyroidism She spends 3 months/year at [facility] as she does not tolerate the heat  Calcium score 165 which was 68 th percentile for age and sex. He lipitor dose was just increased as her LDL was in the 90 range She has no other signs of vascualar disease   Cleared to have reverse right shoulder surgery by PA 09/17/23 NM PET 09/12/23 showed normal perfusion EF with stress 71% and normal mildly abnormal MBFR due to high resting flow.   She has not had surgery as ortho indicated she would not have any better ROM and she is not having pain    ROS All other systems reviewed and negative except as noted above  Past Medical History:  Diagnosis Date   Chest pain    Dyspnea    Hypertension    Impaired fasting glucose    Shingles    Subclinical hypothyroidism    Thyroid  disease     Family History  Problem Relation Age of Onset   Colon cancer Mother    Breast cancer Mother    Hypertension Mother    Thyroid  disease Mother    Atrial fibrillation Father    Cervical cancer Sister    Diabetes type II Sister    Heart disease Paternal Grandmother    Heart attack Paternal Grandfather    Gallbladder disease Paternal Grandfather    Diabetes Paternal Grandfather     Social History   Socioeconomic History   Marital status: Married    Spouse name: Not on file   Number of children: Not on file   Years of education: Not on file   Highest education level: Not on file  Occupational History   Not on file  Tobacco Use   Smoking status: Never   Smokeless  tobacco: Never  Vaping Use   Vaping status: Never Used  Substance and Sexual Activity   Alcohol  use: Not Currently    Comment: 2 GLASSES OF WINE A DAY   Drug use: Never   Sexual activity: Not Currently    Partners: Male    Birth control/protection: Post-menopausal    Comment: 1st intercourse-  partners- less than 5,   Other Topics Concern   Not on file  Social History Narrative   Not on file   Social Drivers of Health   Financial Resource Strain: Not on file  Food Insecurity: Not on file  Transportation Needs: Not on file  Physical Activity: Not on file  Stress: Not on file  Social Connections: Not on file  Intimate Partner Violence: Not on file    Past Surgical History:  Procedure Laterality Date   KNEE ARTHROSCOPY Left    LUNG REMOVAL, PARTIAL     VEIN LIGATION AND STRIPPING     WRIST SURGERY Left       Current Outpatient Medications:    atorvastatin (LIPITOR) 20 MG tablet, Take 20 mg by mouth daily., Disp: , Rfl:    ezetimibe (ZETIA) 10 MG tablet, Take 10 mg by mouth daily., Disp: , Rfl:    levothyroxine (SYNTHROID) 25  MCG tablet, Take 25 mcg by mouth daily., Disp: , Rfl:    metoprolol  succinate (TOPROL  XL) 25 MG 24 hr tablet, Take 1 tablet (25 mg total) by mouth daily., Disp: 90 tablet, Rfl: 3   Multiple Vitamins-Minerals (MULTIVITAMIN WITH MINERALS) tablet, Take 1 tablet by mouth daily., Disp: , Rfl:    Calcium Carb-Cholecalciferol (CALCIUM 600 + D PO), Take 1 tablet by mouth daily. (Patient not taking: Reported on 01/29/2024), Disp: , Rfl:    hydrochlorothiazide (HYDRODIURIL) 25 MG tablet, Take 12.5 mg by mouth daily. (Patient not taking: Reported on 01/29/2024), Disp: , Rfl:    Omega-3 Fatty Acids (FISH OIL TRIPLE STRENGTH) 1400 MG CAPS, Take 1 capsule by mouth daily. (Patient not taking: Reported on 01/29/2024), Disp: , Rfl:    UNABLE TO FIND, 1,500 mg. Med Name: tumeric po (Patient not taking: Reported on 01/29/2024), Disp: , Rfl:     Physical Exam: Blood pressure  136/76, pulse 83, height 5' 6 (1.676 m), weight 176 lb 6.4 oz (80 kg), SpO2 91%.    Affect appropriate Healthy:  appears stated age HEENT: normal Neck supple with no adenopathy JVP normal no bruits no thyromegaly Lungs clear with no wheezing and good diaphragmatic motion Heart:  S1/S2 no murmur, no rub, gallop or click PMI normal Abdomen: benighn, BS positve, no tenderness, no AAA no bruit.  No HSM or HJR Distal pulses intact with no bruits No edema Post right reverse shoulder surgery   Labs:  No results found for: WBC, HGB, HCT, MCV, PLT No results for input(s): NA, K, CL, CO2, BUN, CREATININE, CALCIUM, PROT, BILITOT, ALKPHOS, ALT, AST, GLUCOSE in the last 168 hours.  Invalid input(s): LABALBU No results found for: CKTOTAL, CKMB, CKMBINDEX, TROPONINI No results found for: CHOL No results found for: HDL No results found for: LDLCALC No results found for: TRIG No results found for: CHOLHDL No results found for: LDLDIRECT    Radiology: No results found.  EKG: 03/15/22 SR rate 80 ICRBBB LAD non specific ST changes    ASSESSMENT AND PLAN:  CAD: sub clinical on calcium score higher than average percentile Active with no chest pain NM PET with normal EF and perfusion 09/12/23  Target LDL < 70 f/u labs with primary on lipitor and zetia currently  Thyroid :  continue follow labs not on replacement currently HTN; Well controlled.  Continue current medications and low sodium Dash type diet.     F/U in a year   Signed: Maude Hartman 01/29/2024, 10:06 AM

## 2024-01-24 DIAGNOSIS — R269 Unspecified abnormalities of gait and mobility: Secondary | ICD-10-CM | POA: Diagnosis not present

## 2024-01-24 DIAGNOSIS — M25551 Pain in right hip: Secondary | ICD-10-CM | POA: Diagnosis not present

## 2024-01-24 DIAGNOSIS — M25552 Pain in left hip: Secondary | ICD-10-CM | POA: Diagnosis not present

## 2024-01-29 ENCOUNTER — Encounter: Payer: Self-pay | Admitting: Cardiovascular Disease

## 2024-01-29 ENCOUNTER — Ambulatory Visit: Attending: Cardiovascular Disease | Admitting: Cardiovascular Disease

## 2024-01-29 VITALS — BP 136/76 | HR 83 | Ht 66.0 in | Wt 176.4 lb

## 2024-01-29 DIAGNOSIS — I2510 Atherosclerotic heart disease of native coronary artery without angina pectoris: Secondary | ICD-10-CM

## 2024-01-29 DIAGNOSIS — E785 Hyperlipidemia, unspecified: Secondary | ICD-10-CM | POA: Diagnosis not present

## 2024-01-29 DIAGNOSIS — R0602 Shortness of breath: Secondary | ICD-10-CM

## 2024-01-29 NOTE — Patient Instructions (Signed)
 Medication Instructions:  Your physician recommends that you continue on your current medications as directed. Please refer to the Current Medication list given to you today.  *If you need a refill on your cardiac medications before your next appointment, please call your pharmacy*  Lab Work:  If you have labs (blood work) drawn today and your tests are completely normal, you will receive your results only by: MyChart Message (if you have MyChart) OR A paper copy in the mail If you have any lab test that is abnormal or we need to change your treatment, we will call you to review the results.  Testing/Procedures:   Follow-Up: At [facility], you and your health needs are our priority.  As part of our continuing mission to provide you with exceptional heart care, our providers are all part of one team.  This team includes your primary Cardiologist (physician) and Advanced Practice Providers or APPs (Physician Assistants and Nurse Practitioners) who all work together to provide you with the care you need, when you need it.  Your next appointment:   1 year  Provider:   Maude Emmer, MD

## 2024-02-07 DIAGNOSIS — D485 Neoplasm of uncertain behavior of skin: Secondary | ICD-10-CM | POA: Diagnosis not present

## 2024-02-07 DIAGNOSIS — L820 Inflamed seborrheic keratosis: Secondary | ICD-10-CM | POA: Diagnosis not present

## 2024-02-07 DIAGNOSIS — Z85828 Personal history of other malignant neoplasm of skin: Secondary | ICD-10-CM | POA: Diagnosis not present

## 2024-02-07 DIAGNOSIS — L570 Actinic keratosis: Secondary | ICD-10-CM | POA: Diagnosis not present

## 2024-02-14 DIAGNOSIS — M25552 Pain in left hip: Secondary | ICD-10-CM | POA: Diagnosis not present

## 2024-02-14 DIAGNOSIS — R269 Unspecified abnormalities of gait and mobility: Secondary | ICD-10-CM | POA: Diagnosis not present

## 2024-02-14 DIAGNOSIS — M25551 Pain in right hip: Secondary | ICD-10-CM | POA: Diagnosis not present

## 2024-02-28 DIAGNOSIS — N182 Chronic kidney disease, stage 2 (mild): Secondary | ICD-10-CM | POA: Diagnosis not present

## 2024-02-28 DIAGNOSIS — R7301 Impaired fasting glucose: Secondary | ICD-10-CM | POA: Diagnosis not present

## 2024-02-28 DIAGNOSIS — R809 Proteinuria, unspecified: Secondary | ICD-10-CM | POA: Diagnosis not present

## 2024-02-28 DIAGNOSIS — J302 Other seasonal allergic rhinitis: Secondary | ICD-10-CM | POA: Diagnosis not present

## 2024-02-28 DIAGNOSIS — I1310 Hypertensive heart and chronic kidney disease without heart failure, with stage 1 through stage 4 chronic kidney disease, or unspecified chronic kidney disease: Secondary | ICD-10-CM | POA: Diagnosis not present

## 2024-02-28 DIAGNOSIS — E782 Mixed hyperlipidemia: Secondary | ICD-10-CM | POA: Diagnosis not present

## 2024-02-28 DIAGNOSIS — I10 Essential (primary) hypertension: Secondary | ICD-10-CM | POA: Diagnosis not present

## 2024-02-28 DIAGNOSIS — M8580 Other specified disorders of bone density and structure, unspecified site: Secondary | ICD-10-CM | POA: Diagnosis not present

## 2024-02-28 DIAGNOSIS — I2510 Atherosclerotic heart disease of native coronary artery without angina pectoris: Secondary | ICD-10-CM | POA: Diagnosis not present

## 2024-03-13 DIAGNOSIS — M25551 Pain in right hip: Secondary | ICD-10-CM | POA: Diagnosis not present

## 2024-03-13 DIAGNOSIS — M25552 Pain in left hip: Secondary | ICD-10-CM | POA: Diagnosis not present

## 2024-03-13 DIAGNOSIS — R269 Unspecified abnormalities of gait and mobility: Secondary | ICD-10-CM | POA: Diagnosis not present

## 2024-04-16 DIAGNOSIS — Z23 Encounter for immunization: Secondary | ICD-10-CM | POA: Diagnosis not present

## 2024-04-17 DIAGNOSIS — M25551 Pain in right hip: Secondary | ICD-10-CM | POA: Diagnosis not present

## 2024-04-17 DIAGNOSIS — M25552 Pain in left hip: Secondary | ICD-10-CM | POA: Diagnosis not present

## 2024-04-17 DIAGNOSIS — R269 Unspecified abnormalities of gait and mobility: Secondary | ICD-10-CM | POA: Diagnosis not present

## 2024-08-01 ENCOUNTER — Encounter

## 2024-08-01 DIAGNOSIS — Z1231 Encounter for screening mammogram for malignant neoplasm of breast: Secondary | ICD-10-CM
